# Patient Record
Sex: Female | Born: 1970 | Race: White | Hispanic: Yes | Marital: Single | Smoking: Never smoker
Health system: Southern US, Community
[De-identification: ages and names within clinical notes are randomized; demographics above are authoritative.]

---

## 2004-12-08 ENCOUNTER — Emergency Department (HOSPITAL_COMMUNITY): Admission: EM | Admit: 2004-12-08 | Discharge: 2004-12-08 | Payer: Self-pay | Admitting: Emergency Medicine

## 2004-12-11 ENCOUNTER — Inpatient Hospital Stay (HOSPITAL_COMMUNITY): Admission: AD | Admit: 2004-12-11 | Discharge: 2004-12-11 | Payer: Self-pay | Admitting: *Deleted

## 2004-12-11 ENCOUNTER — Encounter (INDEPENDENT_AMBULATORY_CARE_PROVIDER_SITE_OTHER): Payer: Self-pay | Admitting: Specialist

## 2004-12-11 ENCOUNTER — Emergency Department (HOSPITAL_COMMUNITY): Admission: EM | Admit: 2004-12-11 | Discharge: 2004-12-11 | Payer: Self-pay | Admitting: Emergency Medicine

## 2004-12-20 ENCOUNTER — Ambulatory Visit: Payer: Self-pay | Admitting: Family Medicine

## 2005-06-08 ENCOUNTER — Emergency Department (HOSPITAL_COMMUNITY): Admission: EM | Admit: 2005-06-08 | Discharge: 2005-06-08 | Payer: Self-pay | Admitting: Emergency Medicine

## 2006-12-07 ENCOUNTER — Inpatient Hospital Stay (HOSPITAL_COMMUNITY): Admission: AD | Admit: 2006-12-07 | Discharge: 2006-12-10 | Payer: Self-pay | Admitting: Obstetrics

## 2010-03-03 ENCOUNTER — Encounter: Payer: Self-pay | Admitting: *Deleted

## 2010-05-17 LAB — ABO/RH: RH Type: POSITIVE

## 2010-05-17 LAB — ANTIBODY SCREEN: Antibody Screen: NEGATIVE

## 2010-05-17 LAB — HEPATITIS B SURFACE ANTIGEN: Hepatitis B Surface Ag: NEGATIVE

## 2010-05-17 LAB — RUBELLA ANTIBODY, IGM: Rubella: UNDETERMINED

## 2010-05-17 LAB — RPR: RPR: NONREACTIVE

## 2010-06-26 NOTE — Op Note (Signed)
[name], Jacqueline Serrano      ACCOUNT NO.:  [id number]   MEDICAL RECORD NO.:  [id number]          PATIENT TYPE:  INP   LOCATION:  9135                          [facility]   PHYSICIAN:  Kathreen Cosier, M.D.DATE OF BIRTH:  1970/07/15   DATE OF PROCEDURE:  12/07/2006  DATE OF DISCHARGE:                               OPERATIVE REPORT   PREOPERATIVE DIAGNOSES:  1. Intrauterine pregnancy at term.  2. Previous cesarean section with macrosomia, for elective cesarean      section.  3. Ruptured membranes.   SURGEON:  Francoise Ceo, M.D.   ANESTHESIA:  Spinal.   PROCEDURE:  Patient placed on the operating table in the supine position  after spinal administered.  Abdomen prepped and draped.  Bladder emptied  into a Foley catheter.  A transverse suprapubic incision was made  through the old scar, carried down through rectus fascia.  Fascia  cleaned and incised the length the incision.  Rectal muscles retracted  laterally.  Peritoneum incised longitudinally.  A transverse incision  made on the visceral peritoneum above the bladder.  Bladder mobilized  inferiorly.  There was a window where the old scar adhesed.  Fluid was  clear.  Patient delivered from OP position of a female, Apgars 9 and 9,  weighing 9 pounds.  Team was in attendance.  The placenta was posterior  and removed spontaneously and the uterine cavity cleaned with dry laps.  Uterine incision closed in one layer with continuous suture of #1  chromic.  Hemostasis satisfactory.  Bladder flap retention with #0  chromic.  Uterus was contracted, tubes and ovaries normal.  Abdomen  closed in layers.  Peritoneum continuous suture of #0 chromic, fascia  continuous suture of #0 Dexon and the skin closed with a subcuticular  stitch of #4-0 Monocryl.  Blood loss 800 mL.           ______________________________  Kathreen Cosier, M.D.     BAM/MEDQ  D:  12/07/2006  T:  12/07/2006  Job:  [id number]

## 2010-06-29 NOTE — Group Therapy Note (Signed)
NAME:  Jacqueline Serrano, Jacqueline Serrano NO.:  [id number]   MEDICAL RECORD NO.:  [id number]          PATIENT TYPE:  WOC   LOCATION:  [facility]                   FACILITY:  [facility]   PHYSICIAN:  Tinnie Gens, MD        DATE OF BIRTH:  [birth year]   DATE OF SERVICE:                                    CLINIC NOTE   CHIEF COMPLAINT:  Follow up miscarriage.   HISTORY OF PRESENT ILLNESS:  The patient is a 40 year old gravida 2, para 1-  0-1-1 who is status post SAB on December 11, 2004 who had products of  conception sent to pathology which indeed showed products of conception.  The patient reports passing a bit more tissue on the next day following that  and then bleeding on and off for the last 10 days, but has stopped over the  last 3 days.  She is having no significant pain.  No fever.  No chills.  No  nausea.  No vomiting.   PAST MEDICAL HISTORY:  She has had several kidney infections.   PAST SURGICAL HISTORY:  C section.   MEDICATIONS:  None.   ALLERGIES:  None known.   OBSTETRICAL HISTORY:  G2, P1, one C section for what she says was a large  baby based on ultrasound in [place].   GYNECOLOGICAL HISTORY:  Menarche at age 65.  Cycles every month.  It lasts  for 4 days.  Medium flow.  No history of abnormal Pap.  Last Pap was 3  months ago at [facility].   FAMILY HISTORY:  Negative.   SOCIAL HISTORY:  No tobacco, alcohol or drug use.   REVIEW OF SYSTEMS:  A 14-point review of systems was reviewed.  Please see  GYN history on the chart.  The patient does report some headache around the  time of miscarriage.  These have resolved.  Some belly pain and bleeding  vaginally is described in the HPI, but otherwise is negative.   PHYSICAL EXAMINATION:  VITAL SIGNS:  Her vital signs are as noted on the  chart.  Her blood pressure is 115/76.  Weight is 126.6.  Temp 98.7.  Pulse  76.  GENERAL:  She is a well-developed, well-nourished, Hispanic female in no  acute distress.  ABDOMEN:   Soft, nontender, nondistended.  GU:  Normal external female genitalia.  The vagina is pink and rugated.  The  cervix is long and closed.  The uterus is small and anteverted, firm.  It is  between 4 and 6 weeks' size.   IMPRESSION:  Probable complete abortion.   PLAN:  Advised about miscarriage, causes of miscarriage, likelihood of  repeat very low and need to not get pregnant for the next 3 months because  of increased risk of congenital anomalies.  The patient understood all these  things.  It was explained at least twice about this probably being a  complete abortion given her lack of symptoms and normal exam.  She will  follow up as needed.           ______________________________  Tinnie Gens, MD     TP/MEDQ  D:  12/20/2004  T:  12/20/2004  Job:  [id number]

## 2010-06-29 NOTE — Discharge Summary (Signed)
Jacqueline Serrano, THEDFORD      ACCOUNT NO.:  [id number]   MEDICAL RECORD NO.:  [id number]          PATIENT TYPE:  INP   LOCATION:  9135                          [facility]   PHYSICIAN:  Kathreen Cosier, M.D.DATE OF BIRTH:  [birth year]   DATE OF ADMISSION:  12/07/2006  DATE OF DISCHARGE:  12/10/2006                               DISCHARGE SUMMARY   The patient is a 40 year old gravida 3, para 1-0-1-1, [facility] December 08, 2006.  She had an elective C-section with her first child in [place] as  she had a large baby, and she was now admitted with ruptured membranes  and ultrasound gave an estimated fetal weight of 9 pounds 10 ounces.  The patient elected to have a repeat C-section.  She underwent a repeat  low transverse cesarean section and had a female, Apgar 9 and 9, weighing  9 pounds, from the OP position.  The team was in attendance.  Placenta  was sent to labor and delivery.  On admission her hemoglobin was 10.8,  white count 6.2, platelets 222.  Postop hemoglobin 7.5, platelets 179.  RPR negative.  HIV negative.  The patient was asymptomatic and started  on ferrous sulfate 325 mg p.o. b.i.d.  The patient had an uneventful  postoperative course and was discharged on the third postoperative day  ambulatory on a regular diet, on Tylox one every 3-4 hours p.r.n. for  pain, ferrous sulfate 325 mg one b.i.d. for anemia.   DISCHARGE DIAGNOSIS:  Status post elective low transverse cesarean  section at term because of macrosomia.           ______________________________  Kathreen Cosier, M.D.     BAM/MEDQ  D:  12/31/2006  T:  12/31/2006  Job:  [id number]

## 2010-10-18 ENCOUNTER — Encounter (HOSPITAL_COMMUNITY): Payer: Self-pay

## 2010-10-18 ENCOUNTER — Ambulatory Visit (HOSPITAL_COMMUNITY)
Admission: RE | Admit: 2010-10-18 | Discharge: 2010-10-18 | Disposition: A | Discharge status: Home / Self Care | Payer: Self-pay | Source: Ambulatory Visit | Attending: Obstetrics | Admitting: Obstetrics

## 2010-10-18 ENCOUNTER — Ambulatory Visit (HOSPITAL_COMMUNITY): Payer: Self-pay

## 2010-10-18 DIAGNOSIS — O24410 Gestational diabetes mellitus in pregnancy, diet controlled: Secondary | ICD-10-CM

## 2010-10-18 NOTE — Progress Notes (Signed)
Jacqueline Serrano is a 40 y.o. female presenting for Continued assessment of glycemic responses to maintain FBS <95 mg/dLdaily; PPBG <120 mg/d/L daily.  Maternal Medical History:  Fetal activity: Perceived fetal activity is normal.    Prenatal Complications - Diabetes: gestational. Diabetes is managed by diet.     Three prior cesarean deliveries. Discussed risk of future pregnancies and consideration of a permanent method of family planning. No other medical problems noted.   No past medical history on file. No past surgical history on file. Family History: family history is not on file. Social History:  does not have a smoking history on file. She does not have any smokeless tobacco history on file. Her alcohol and drug histories not on file.  Review of Systems  Constitutional: Negative.   HENT: Negative.   Eyes: Negative.   Respiratory: Negative.   Cardiovascular: Negative.   Gastrointestinal: Negative.   Genitourinary: Negative.   Musculoskeletal: Negative.   Skin: Negative.   All other systems reviewed and are negative.   Maternal Exam:  Abdomen: not evaluated.  Introitus: not evaluated.     Physical Exam  Vitals reviewed. Constitutional: She appears well-developed and well-nourished.      Assessment/Plan: The need for home glucose monitoring regularly, (especially initially), and symptoms of potential hypoglycemia are discussed. To manage with diet/exercise to determine if  fasting glucose is <95 mg/dL, postprandial glucose is <120 mg/dL and then call with results. Patient ws counseled by the diabetic educator for teaching diet and meter use. Fetal ultrasound assessment for growth recommended in two weeks.  Fetal non-stress testing is not required for diet controlled GDM, but if insulin or oral hypoglycemics are initiated later, would then recommend NST's.  Face to face time of consultation was 25 minutes.  Thank you for the opportunity to work with  [place]  Marland KitchenBOTTI,JOE 10/18/2010, 8:15 AM

## 2010-10-18 NOTE — ED Notes (Signed)
  Patient was seen on 10/18/2010 for Gestational Diabetes self-management class at [facility]. The following learning objectives were met by the patient during this course: Client was seen 1:1 with the assistance of the Spanish [employer] from [place].  She questioned and related via interpreter that she understood the materials as presented.   States the definition of Gestational Diabetes  States why dietary management is important in controlling blood glucose  Describes the effects each nutrient has on blood glucose levels  Demonstrates ability to create a balanced meal plan  Demonstrates carbohydrate counting   States when to check blood glucose levels  Demonstrates proper blood glucose monitoring techniques  States the effect of stress and exercise on blood glucose levels  States the importance of limiting caffeine and abstaining from alcohol and smoking  Blood glucose monitor given: No glucose meter was provided.  I did cover the procedure and provided handout containing the steps for glucose testing.  Recommended she purchase a Reli-On glucose meter from [employer] along with the strips and lancets.  She was provided a number of sheets for recording her blood glucose levels.  Her husband speaks [place] and is aware that the [facility] will be calling him to report her blood glucose readings.  His phone is [phone].  Blood glucose reading: 3 hours following lunch was 144mg /dl  Patient instructed to monitor glucose levels:Fasting and 2 hours after the first bite of each meal. FBS: 60 - <90 2 hour: <120  *Patient received handouts:  Nutrition Diabetes and Pregnancy  Carbohydrate Counting List All handouts were in Spanish. Patient will be seen for follow-up as needed and the [facility] nurse will be calling for weekly blood glucose levels for the MD to follow.

## 2010-11-01 ENCOUNTER — Encounter (HOSPITAL_COMMUNITY): Payer: Self-pay

## 2010-11-01 ENCOUNTER — Ambulatory Visit (HOSPITAL_COMMUNITY)
Admission: RE | Admit: 2010-11-01 | Discharge: 2010-11-01 | Disposition: A | Discharge status: Home / Self Care | Payer: Self-pay | Source: Ambulatory Visit | Attending: Obstetrics | Admitting: Obstetrics

## 2010-11-01 VITALS — BP 108/56 | HR 81 | Wt 149.0 lb

## 2010-11-01 DIAGNOSIS — O09529 Supervision of elderly multigravida, unspecified trimester: Secondary | ICD-10-CM | POA: Insufficient documentation

## 2010-11-01 DIAGNOSIS — O34219 Maternal care for unspecified type scar from previous cesarean delivery: Secondary | ICD-10-CM | POA: Insufficient documentation

## 2010-11-01 DIAGNOSIS — O99810 Abnormal glucose complicating pregnancy: Secondary | ICD-10-CM | POA: Insufficient documentation

## 2010-11-01 DIAGNOSIS — O24410 Gestational diabetes mellitus in pregnancy, diet controlled: Secondary | ICD-10-CM

## 2010-11-11 ENCOUNTER — Inpatient Hospital Stay (HOSPITAL_COMMUNITY): Admission: AD | Admit: 2010-11-11 | Payer: Self-pay | Source: Ambulatory Visit | Admitting: Obstetrics

## 2010-11-21 LAB — CBC
HCT: 21.3 — ABNORMAL LOW
HCT: 31.2 — ABNORMAL LOW
Hemoglobin: 10.8 — ABNORMAL LOW
Hemoglobin: 7.5 — CL
MCHC: 34.5
MCHC: 35.1
MCV: 88.4
MCV: 88.7
Platelets: 179
Platelets: 222
RBC: 2.4 — ABNORMAL LOW
RBC: 3.54 — ABNORMAL LOW
RDW: 14.2 — ABNORMAL HIGH
RDW: 14.6 — ABNORMAL HIGH
WBC: 6.2
WBC: 7.1

## 2010-11-21 LAB — RPR: RPR Ser Ql: NONREACTIVE

## 2010-11-29 ENCOUNTER — Ambulatory Visit (HOSPITAL_COMMUNITY)
Admission: RE | Admit: 2010-11-29 | Discharge: 2010-11-29 | Disposition: A | Discharge status: Home / Self Care | Payer: Self-pay | Source: Ambulatory Visit | Attending: Obstetrics | Admitting: Obstetrics

## 2010-11-29 DIAGNOSIS — O24410 Gestational diabetes mellitus in pregnancy, diet controlled: Secondary | ICD-10-CM

## 2010-11-29 DIAGNOSIS — O34219 Maternal care for unspecified type scar from previous cesarean delivery: Secondary | ICD-10-CM | POA: Insufficient documentation

## 2010-11-29 DIAGNOSIS — O09529 Supervision of elderly multigravida, unspecified trimester: Secondary | ICD-10-CM | POA: Insufficient documentation

## 2010-11-29 DIAGNOSIS — O99810 Abnormal glucose complicating pregnancy: Secondary | ICD-10-CM | POA: Insufficient documentation

## 2010-12-17 NOTE — Patient Instructions (Addendum)
   Your procedure is scheduled on:  Tuesday, Nov. 13  Enter through the [employer] of [facility] at:  6:00am Pick up the phone at the desk and dial [phone] and inform us of your arrival.  Please call this number if you have any problems the morning of surgery: [phone]  Remember: Do not eat food after midnight: Monday Do not drink clear liquids after: Monday Take these medicines the morning of surgery with a SIP OF WATER:none  Do not wear jewelry, make-up, or FINGER nail polish Do not wear lotions, powders, or perfumes.  You may not  wear deodorant. Do not shave 48 hours prior to surgery. Do not bring valuables to the hospital.  Leave suitcase in the car. After Surgery it may be brought to your room. For patients being admitted to the hospital, checkout time is 11:00am the day of discharge.    Remember to use your hibiclens as instructed.Please shower with 1/2 bottle the evening before your surgery and the other 1/2 bottle the morning of surgery.

## 2010-12-18 MED ORDER — OXYTOCIN 10 UNIT/ML IJ SOLN
INTRAMUSCULAR | Status: AC
  Filled 2010-12-18: qty 4

## 2010-12-18 MED ORDER — ONDANSETRON HCL 4 MG/2ML IJ SOLN
INTRAMUSCULAR | Status: AC
  Filled 2010-12-18: qty 2

## 2010-12-18 MED ORDER — MORPHINE SULFATE 0.5 MG/ML IJ SOLN
INTRAMUSCULAR | Status: AC
  Filled 2010-12-18: qty 10

## 2010-12-18 MED ORDER — SODIUM BICARBONATE 8.4 % IV SOLN
INTRAVENOUS | Status: AC
  Filled 2010-12-18: qty 50

## 2010-12-18 MED ORDER — LIDOCAINE-EPINEPHRINE (PF) 2 %-1:200000 IJ SOLN
INTRAMUSCULAR | Status: AC
  Filled 2010-12-18: qty 20

## 2010-12-19 ENCOUNTER — Other Ambulatory Visit: Payer: Self-pay | Admitting: Obstetrics

## 2010-12-20 ENCOUNTER — Encounter (HOSPITAL_COMMUNITY)
Admission: RE | Admit: 2010-12-20 | Discharge: 2010-12-20 | Disposition: A | Discharge status: Home / Self Care | Payer: Medicaid Other | Source: Ambulatory Visit | Attending: Obstetrics | Admitting: Obstetrics

## 2010-12-20 ENCOUNTER — Encounter (HOSPITAL_COMMUNITY): Payer: Self-pay

## 2010-12-20 LAB — CBC
HCT: 35 % — ABNORMAL LOW (ref 36.0–46.0)
MCH: 29.5 pg (ref 26.0–34.0)
MCHC: 32.6 g/dL (ref 30.0–36.0)
Platelets: 196 10*3/uL (ref 150–400)
RBC: 3.87 MIL/uL (ref 3.87–5.11)
RDW: 16.3 % — ABNORMAL HIGH (ref 11.5–15.5)
WBC: 5.8 10*3/uL (ref 4.0–10.5)

## 2010-12-20 LAB — BASIC METABOLIC PANEL
BUN: 10 mg/dL (ref 6–23)
CO2: 24 mEq/L (ref 19–32)
Calcium: 9.1 mg/dL (ref 8.4–10.5)
Creatinine, Ser: 0.54 mg/dL (ref 0.50–1.10)
GFR calc Af Amer: >90 mL/min (ref >90)
GFR calc non Af Amer: >90 mL/min (ref >90)
Glucose, Bld: 95 mg/dL (ref 70–99)
Potassium: 3.7 mEq/L (ref 3.5–5.1)
Sodium: 134 mEq/L — ABNORMAL LOW (ref 135–145)

## 2010-12-20 LAB — SURGICAL PCR SCREEN: MRSA, PCR: NEGATIVE

## 2010-12-20 NOTE — Pre-Procedure Instructions (Addendum)
PAT visit assisted per Spanish Interpreter-Eda Royal Pt instructions reviewed with husband-speaks [place]

## 2010-12-24 ENCOUNTER — Other Ambulatory Visit: Payer: Self-pay | Admitting: Obstetrics

## 2010-12-24 NOTE — H&P (Signed)
NAMEROISE, EMERT NO.:  [id number]  MEDICAL RECORD NO.:  [id number]  LOCATION:  [facility]                           [facility]  PHYSICIAN:  Kathreen Cosier, M.D.DATE OF BIRTH:  May 04, 1970  DATE OF ADMISSION:  12/20/2010 DATE OF DISCHARGE:  12/20/2010                             HISTORY & PHYSICAL   The patient is a 40 year old, gravida 4, para 2-0-1-2, whose due date is December 31, 2010.  She has had 2 previous C-sections and is now at 39 weeks for repeat C-section.  PAST MEDICAL HISTORY:  She is a gestational diabetic whose sugars are normal and controlled by diet.  PAST SURGICAL HISTORY:  She has had 2 C-sections.  SOCIAL HISTORY:  Negative.  SYSTEM REVIEW:  She has a history of depression and was on Zoloft 50 mg daily.  PHYSICAL EXAMINATION:  GENERAL:  A well-developed female in no distress. HEENT:  Negative. LUNGS:  Clear to P and A. HEART:  Regular rhythm.  No murmurs or gallops. BREASTS:  No masses. ABDOMEN:  Term pelvic.  Cervix closed. EXTREMITIES:  Negative.          ______________________________ Kathreen Cosier, M.D.     BAM/MEDQ  D:  12/24/2010  T:  12/24/2010  Job:  [id number]

## 2010-12-25 ENCOUNTER — Encounter (HOSPITAL_COMMUNITY): Payer: Self-pay | Admitting: Anesthesiology

## 2010-12-25 ENCOUNTER — Inpatient Hospital Stay (HOSPITAL_COMMUNITY)
Admission: RE | Admit: 2010-12-25 | Discharge: 2010-12-28 | DRG: 766 | Disposition: A | Discharge status: Home / Self Care | Payer: Medicaid Other | Source: Ambulatory Visit | Attending: Obstetrics | Admitting: Obstetrics

## 2010-12-25 ENCOUNTER — Encounter (HOSPITAL_COMMUNITY): Payer: Self-pay | Admitting: *Deleted

## 2010-12-25 ENCOUNTER — Inpatient Hospital Stay (HOSPITAL_COMMUNITY): Payer: Medicaid Other | Admitting: Anesthesiology

## 2010-12-25 ENCOUNTER — Encounter (HOSPITAL_COMMUNITY)
Admission: RE | Disposition: A | Discharge status: Home / Self Care | Payer: Self-pay | Source: Ambulatory Visit | Attending: Obstetrics

## 2010-12-25 DIAGNOSIS — Z01812 Encounter for preprocedural laboratory examination: Secondary | ICD-10-CM

## 2010-12-25 DIAGNOSIS — O09529 Supervision of elderly multigravida, unspecified trimester: Secondary | ICD-10-CM | POA: Diagnosis present

## 2010-12-25 DIAGNOSIS — O34219 Maternal care for unspecified type scar from previous cesarean delivery: Principal | ICD-10-CM | POA: Diagnosis present

## 2010-12-25 DIAGNOSIS — Z01818 Encounter for other preprocedural examination: Secondary | ICD-10-CM

## 2010-12-25 DIAGNOSIS — O9903 Anemia complicating the puerperium: Secondary | ICD-10-CM | POA: Clinically undetermined

## 2010-12-25 DIAGNOSIS — D649 Anemia, unspecified: Secondary | ICD-10-CM | POA: Diagnosis not present

## 2010-12-25 LAB — ABO/RH: ABO/RH(D): O POS

## 2010-12-25 SURGERY — Surgical Case
Anesthesia: Spinal | Laterality: Bilateral

## 2010-12-25 MED ORDER — MEPERIDINE HCL 25 MG/ML IJ SOLN: 6.2500 mg | INTRAMUSCULAR | Status: DC | PRN

## 2010-12-25 MED ORDER — PHENYLEPHRINE HCL 10 MG/ML IJ SOLN
INTRAMUSCULAR | Status: DC | PRN
  Administered 2010-12-25 (×2): 40 ug via INTRAVENOUS

## 2010-12-25 MED ORDER — IBUPROFEN 600 MG PO TABS
600.0000 mg | ORAL_TABLET | Freq: Four times a day (QID) | ORAL | Status: DC | PRN
  Filled 2010-12-25 (×12): qty 1

## 2010-12-25 MED ORDER — LACTATED RINGERS IV SOLN
INTRAVENOUS | Status: DC
  Administered 2010-12-25: 07:00:00 via INTRAVENOUS
  Administered 2010-12-25: 125 mL/h via INTRAVENOUS
  Administered 2010-12-25 (×2): via INTRAVENOUS

## 2010-12-25 MED ORDER — HYDROMORPHONE HCL PF 1 MG/ML IJ SOLN: 0.2500 mg | INTRAMUSCULAR | Status: DC | PRN

## 2010-12-25 MED ORDER — NALOXONE HCL 0.4 MG/ML IJ SOLN: 0.4000 mg | INTRAMUSCULAR | Status: DC | PRN

## 2010-12-25 MED ORDER — SCOPOLAMINE 1 MG/3DAYS TD PT72
1.0000 | MEDICATED_PATCH | Freq: Once | TRANSDERMAL | Status: DC
  Administered 2010-12-25: 1.5 mg via TRANSDERMAL

## 2010-12-25 MED ORDER — KETOROLAC TROMETHAMINE 30 MG/ML IJ SOLN: 30.0000 mg | Freq: Four times a day (QID) | INTRAMUSCULAR | Status: AC | PRN

## 2010-12-25 MED ORDER — ONDANSETRON HCL 4 MG/2ML IJ SOLN
INTRAMUSCULAR | Status: DC | PRN
  Administered 2010-12-25: 4 mg via INTRAVENOUS

## 2010-12-25 MED ORDER — LANOLIN HYDROUS EX OINT: 1.0000 "application " | TOPICAL_OINTMENT | CUTANEOUS | Status: DC | PRN

## 2010-12-25 MED ORDER — NALBUPHINE HCL 10 MG/ML IJ SOLN
5.0000 mg | INTRAMUSCULAR | Status: DC | PRN
  Filled 2010-12-25: qty 1

## 2010-12-25 MED ORDER — ONDANSETRON HCL 4 MG/2ML IJ SOLN
INTRAMUSCULAR | Status: AC
  Filled 2010-12-25: qty 2

## 2010-12-25 MED ORDER — DIPHENHYDRAMINE HCL 25 MG PO CAPS: 25.0000 mg | ORAL_CAPSULE | ORAL | Status: DC | PRN

## 2010-12-25 MED ORDER — CEFAZOLIN SODIUM 1-5 GM-% IV SOLN
INTRAVENOUS | Status: AC
  Filled 2010-12-25: qty 50

## 2010-12-25 MED ORDER — ONDANSETRON HCL 4 MG PO TABS: 4.0000 mg | ORAL_TABLET | ORAL | Status: DC | PRN

## 2010-12-25 MED ORDER — PRENATAL PLUS 27-1 MG PO TABS
1.0000 | ORAL_TABLET | Freq: Every day | ORAL | Status: DC
  Administered 2010-12-26 – 2010-12-28 (×3): 1 via ORAL
  Filled 2010-12-25 (×3): qty 1

## 2010-12-25 MED ORDER — DIPHENHYDRAMINE HCL 50 MG/ML IJ SOLN: 25.0000 mg | INTRAMUSCULAR | Status: DC | PRN

## 2010-12-25 MED ORDER — OXYTOCIN 10 UNIT/ML IJ SOLN
INTRAMUSCULAR | Status: AC
  Filled 2010-12-25: qty 4

## 2010-12-25 MED ORDER — SCOPOLAMINE 1 MG/3DAYS TD PT72
MEDICATED_PATCH | TRANSDERMAL | Status: AC
  Administered 2010-12-25: 1.5 mg via TRANSDERMAL
  Filled 2010-12-25: qty 1

## 2010-12-25 MED ORDER — SIMETHICONE 80 MG PO CHEW
80.0000 mg | CHEWABLE_TABLET | Freq: Three times a day (TID) | ORAL | Status: DC
  Administered 2010-12-25 – 2010-12-28 (×11): 80 mg via ORAL

## 2010-12-25 MED ORDER — IBUPROFEN 600 MG PO TABS
600.0000 mg | ORAL_TABLET | Freq: Four times a day (QID) | ORAL | Status: DC
  Administered 2010-12-25 – 2010-12-28 (×12): 600 mg via ORAL

## 2010-12-25 MED ORDER — EPHEDRINE SULFATE 50 MG/ML IJ SOLN
INTRAMUSCULAR | Status: DC | PRN
  Administered 2010-12-25 (×5): 5 mg via INTRAVENOUS

## 2010-12-25 MED ORDER — SODIUM CHLORIDE 0.9 % IV SOLN
1.0000 ug/kg/h | INTRAVENOUS | Status: DC | PRN
  Filled 2010-12-25: qty 2.5

## 2010-12-25 MED ORDER — WITCH HAZEL-GLYCERIN EX PADS: 1.0000 "application " | MEDICATED_PAD | CUTANEOUS | Status: DC | PRN

## 2010-12-25 MED ORDER — MENTHOL 3 MG MT LOZG: 1.0000 | LOZENGE | OROMUCOSAL | Status: DC | PRN

## 2010-12-25 MED ORDER — SODIUM CHLORIDE 0.9 % IJ SOLN: 3.0000 mL | INTRAMUSCULAR | Status: DC | PRN

## 2010-12-25 MED ORDER — ZOLPIDEM TARTRATE 5 MG PO TABS: 5.0000 mg | ORAL_TABLET | Freq: Every evening | ORAL | Status: DC | PRN

## 2010-12-25 MED ORDER — OXYCODONE-ACETAMINOPHEN 5-325 MG PO TABS
1.0000 | ORAL_TABLET | ORAL | Status: DC | PRN
  Administered 2010-12-26 – 2010-12-27 (×3): 1 via ORAL
  Filled 2010-12-25 (×3): qty 1

## 2010-12-25 MED ORDER — SENNOSIDES-DOCUSATE SODIUM 8.6-50 MG PO TABS
2.0000 | ORAL_TABLET | Freq: Every day | ORAL | Status: DC
  Administered 2010-12-25 – 2010-12-27 (×3): 2 via ORAL

## 2010-12-25 MED ORDER — EPHEDRINE 5 MG/ML INJ
INTRAVENOUS | Status: AC
  Filled 2010-12-25: qty 10

## 2010-12-25 MED ORDER — OXYTOCIN 20 UNITS IN LACTATED RINGERS INFUSION - SIMPLE
INTRAVENOUS | Status: DC | PRN
  Administered 2010-12-25: 20 [IU] via INTRAVENOUS

## 2010-12-25 MED ORDER — PHENYLEPHRINE 40 MCG/ML (10ML) SYRINGE FOR IV PUSH (FOR BLOOD PRESSURE SUPPORT)
PREFILLED_SYRINGE | INTRAVENOUS | Status: AC
  Filled 2010-12-25: qty 5

## 2010-12-25 MED ORDER — CEFAZOLIN SODIUM 1-5 GM-% IV SOLN
1.0000 g | Freq: Once | INTRAVENOUS | Status: AC
  Administered 2010-12-25: 1 g via INTRAVENOUS

## 2010-12-25 MED ORDER — BUPIVACAINE IN DEXTROSE 0.75-8.25 % IT SOLN
INTRATHECAL | Status: DC | PRN
  Administered 2010-12-25: 11.75 mg via INTRATHECAL

## 2010-12-25 MED ORDER — KETOROLAC TROMETHAMINE 60 MG/2ML IM SOLN
INTRAMUSCULAR | Status: AC
  Administered 2010-12-25: 60 mg via INTRAMUSCULAR
  Filled 2010-12-25: qty 2

## 2010-12-25 MED ORDER — KETOROLAC TROMETHAMINE 30 MG/ML IJ SOLN: 15.0000 mg | Freq: Once | INTRAMUSCULAR | Status: DC | PRN

## 2010-12-25 MED ORDER — FENTANYL CITRATE 0.05 MG/ML IJ SOLN
INTRAMUSCULAR | Status: AC
  Filled 2010-12-25: qty 2

## 2010-12-25 MED ORDER — ONDANSETRON HCL 4 MG/2ML IJ SOLN: 4.0000 mg | INTRAMUSCULAR | Status: DC | PRN

## 2010-12-25 MED ORDER — FENTANYL CITRATE 0.05 MG/ML IJ SOLN
INTRAMUSCULAR | Status: DC | PRN
  Administered 2010-12-25: 15 ug via INTRATHECAL

## 2010-12-25 MED ORDER — MORPHINE SULFATE (PF) 0.5 MG/ML IJ SOLN
INTRAMUSCULAR | Status: DC | PRN
  Administered 2010-12-25: .1 mg via INTRATHECAL

## 2010-12-25 MED ORDER — DIPHENHYDRAMINE HCL 50 MG/ML IJ SOLN: 12.5000 mg | INTRAMUSCULAR | Status: DC | PRN

## 2010-12-25 MED ORDER — MORPHINE SULFATE 0.5 MG/ML IJ SOLN
INTRAMUSCULAR | Status: AC
  Filled 2010-12-25: qty 10

## 2010-12-25 MED ORDER — LACTATED RINGERS IV SOLN: INTRAVENOUS | Status: DC

## 2010-12-25 MED ORDER — DIPHENHYDRAMINE HCL 25 MG PO CAPS: 25.0000 mg | ORAL_CAPSULE | Freq: Four times a day (QID) | ORAL | Status: DC | PRN

## 2010-12-25 MED ORDER — PROMETHAZINE HCL 25 MG/ML IJ SOLN: 6.2500 mg | INTRAMUSCULAR | Status: DC | PRN

## 2010-12-25 MED ORDER — SIMETHICONE 80 MG PO CHEW
80.0000 mg | CHEWABLE_TABLET | ORAL | Status: DC | PRN
  Administered 2010-12-25: 80 mg via ORAL

## 2010-12-25 MED ORDER — DIBUCAINE 1 % RE OINT: 1.0000 "application " | TOPICAL_OINTMENT | RECTAL | Status: DC | PRN

## 2010-12-25 MED ORDER — TETANUS-DIPHTH-ACELL PERTUSSIS 5-2.5-18.5 LF-MCG/0.5 IM SUSP
0.5000 mL | Freq: Once | INTRAMUSCULAR | Status: AC
  Administered 2010-12-26: 0.5 mL via INTRAMUSCULAR

## 2010-12-25 MED ORDER — ONDANSETRON HCL 4 MG/2ML IJ SOLN: 4.0000 mg | Freq: Three times a day (TID) | INTRAMUSCULAR | Status: DC | PRN

## 2010-12-25 MED ORDER — OXYTOCIN 20 UNITS IN LACTATED RINGERS INFUSION - SIMPLE: 125.0000 mL/h | INTRAVENOUS | Status: AC

## 2010-12-25 MED ORDER — KETOROLAC TROMETHAMINE 60 MG/2ML IM SOLN
60.0000 mg | Freq: Once | INTRAMUSCULAR | Status: AC | PRN
  Administered 2010-12-25: 60 mg via INTRAMUSCULAR
  Filled 2010-12-25: qty 2

## 2010-12-25 SURGICAL SUPPLY — 31 items
CHLORAPREP W/TINT 26ML (MISCELLANEOUS) ×2 IMPLANT
CLOTH BEACON ORANGE TIMEOUT ST (SAFETY) ×2 IMPLANT
CONTAINER PREFILL 10% NBF 15ML (MISCELLANEOUS) ×4 IMPLANT
DERMABOND ADVANCED (GAUZE/BANDAGES/DRESSINGS) ×1
DERMABOND ADVANCED .7 DNX12 (GAUZE/BANDAGES/DRESSINGS) ×1 IMPLANT
DRESSING TELFA 8X3 (GAUZE/BANDAGES/DRESSINGS) IMPLANT
ELECT REM PT RETURN 9FT ADLT (ELECTROSURGICAL) ×2
ELECTRODE REM PT RTRN 9FT ADLT (ELECTROSURGICAL) ×1 IMPLANT
EXTRACTOR VACUUM M CUP 4 TUBE (SUCTIONS) IMPLANT
GAUZE SPONGE 4X4 12PLY STRL LF (GAUZE/BANDAGES/DRESSINGS) ×4 IMPLANT
GLOVE BIO SURGEON STRL SZ8.5 (GLOVE) ×4 IMPLANT
GOWN PREVENTION PLUS LG XLONG (DISPOSABLE) ×4 IMPLANT
GOWN PREVENTION PLUS XXLARGE (GOWN DISPOSABLE) ×2 IMPLANT
KIT ABG SYR 3ML LUER SLIP (SYRINGE) IMPLANT
NEEDLE HYPO 25X5/8 SAFETYGLIDE (NEEDLE) ×2 IMPLANT
NS IRRIG 1000ML POUR BTL (IV SOLUTION) ×2 IMPLANT
PACK C SECTION WH (CUSTOM PROCEDURE TRAY) ×2 IMPLANT
PAD ABD 7.5X8 STRL (GAUZE/BANDAGES/DRESSINGS) IMPLANT
SLEEVE SCD COMPRESS KNEE MED (MISCELLANEOUS) IMPLANT
SUT CHROMIC 0 CT 802H (SUTURE) ×2 IMPLANT
SUT CHROMIC 1 CTX 36 (SUTURE) ×6 IMPLANT
SUT CHROMIC 2 0 SH (SUTURE) ×2 IMPLANT
SUT GUT PLAIN 0 CT-3 TAN 27 (SUTURE) ×2 IMPLANT
SUT MON AB 4-0 PS1 27 (SUTURE) ×2 IMPLANT
SUT VIC AB 0 CT1 18XCR BRD8 (SUTURE) IMPLANT
SUT VIC AB 0 CT1 8-18 (SUTURE)
SUT VIC AB 0 CTX 36 (SUTURE) ×2
SUT VIC AB 0 CTX36XBRD ANBCTRL (SUTURE) ×2 IMPLANT
TOWEL OR 17X24 6PK STRL BLUE (TOWEL DISPOSABLE) ×4 IMPLANT
TRAY FOLEY CATH 14FR (SET/KITS/TRAYS/PACK) ×2 IMPLANT
WATER STERILE IRR 1000ML POUR (IV SOLUTION) ×2 IMPLANT
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
## 2010-12-25 NOTE — Progress Notes (Signed)
[facility] Department of Clinical Social Work Documentation of Interpretation   I assisted ___Donna RN________________ with interpretation of __teaching____________________ for this patient. 

## 2010-12-25 NOTE — Anesthesia Postprocedure Evaluation (Signed)
Anesthesia Post Note  Patient: Field seismologist  Procedure(s) Performed:  CESAREAN SECTION WITH BILATERAL TUBAL LIGATION  Anesthesia type: Spinal  Patient location: PACU  Post pain: Pain level controlled  Post assessment: Post-op Vital signs reviewed  Post vital signs: Reviewed  Level of consciousness: awake  Complications: No apparent anesthesia complications

## 2010-12-25 NOTE — Op Note (Signed)
Preop diagnosis previous cesarean section at term Postop diagnosis the same Surgeon Dr. Gaynell Face Anesthesia spinal Procedure patient placed in the operative in the supine position after the spinal administered abdomen prepped and draped data entered with a Foley catheter a transverse incision made through the old scar carried him to the rectus fascia fascia cleaned and incised the length of the incision recti muscles retracted laterally peritoneum opened longitudinally transverse incision made in the visceroperitoneum above the bladder bladder mobilized inferiorly transverse low uterine incision made the fluid was clear patient delivered from the LOA position of a female Apgar 99 weighing 7 lbs. 8 oz. the placenta was anterior removed manually and sent to labor and delivery uterine cavity shows a clean with dry laps the uterine incision closed in one layer with continuous within normal on chromic hemostasis satisfactory bladder flap reattached to a chromic uterus well contracted tubes and ovaries normal abdomen chosen as peritoneum continuous with 2-0 chromic fascia continuous with of 0 Dexon and the skin shows a subcuticular stitch of 4-0 Monocryl blood loss him 150 cc patient tolerated the procedure well taken to recovery room in good condition thank and

## 2010-12-25 NOTE — Anesthesia Preprocedure Evaluation (Signed)
Anesthesia Evaluation  Patient identified by MRN, date of birth, ID band Patient awake    Reviewed: Allergy & Precautions, H&P , NPO status , Patient's Chart, lab work & pertinent test results  Airway Mallampati: II TM Distance: >3 FB Neck ROM: full    Dental No notable dental hx.    Pulmonary neg pulmonary ROS,    Pulmonary exam normal       Cardiovascular neg cardio ROS     Neuro/Psych Negative Neurological ROS  Negative Psych ROS   GI/Hepatic negative GI ROS, Neg liver ROS,   Endo/Other    Renal/GU negative Renal ROS  Genitourinary negative   Musculoskeletal negative musculoskeletal ROS (+)   Abdominal Normal abdominal exam  (+)   Peds negative pediatric ROS (+)  Hematology negative hematology ROS (+)   Anesthesia Other Findings   Reproductive/Obstetrics (+) Pregnancy                           Anesthesia Physical Anesthesia Plan  ASA: II  Anesthesia Plan: Spinal   Post-op Pain Management:    Induction:   Airway Management Planned:   Additional Equipment:   Intra-op Plan:   Post-operative Plan:   Informed Consent: I have reviewed the patients History and Physical, chart, labs and discussed the procedure including the risks, benefits and alternatives for the proposed anesthesia with the patient or authorized representative who has indicated his/her understanding and acceptance.     Plan Discussed with:   Anesthesia Plan Comments:         Anesthesia Quick Evaluation

## 2010-12-25 NOTE — Consult Note (Signed)
Called to attend a repeat C/S at term gestation with no report of risk factors. At delivery infant in vertex with spontaneous cries and active tone. Nuchal cord x 1 loose.  Given tactile stimulation and bulb suction. Shown to parents then carried to Transitional Nursery by father.   Care to assigned pediatrician. Has not voided.   Dagoberto Ligas MD [facility]

## 2010-12-25 NOTE — Transfer of Care (Signed)
Immediate Anesthesia Transfer of Care Note  Patient: Jacqueline Serrano  Procedure(s) Performed:  CESAREAN SECTION WITH BILATERAL TUBAL LIGATION  Patient Location: PACU  Anesthesia Type: Spinal  Level of Consciousness: awake, alert  and oriented  Airway & Oxygen Therapy: Patient Spontanous Breathing  Post-op Assessment: Report given to PACU RN and Post -op Vital signs reviewed and stable  Post vital signs: Reviewed and stable  Complications: No apparent anesthesia complications

## 2010-12-25 NOTE — Progress Notes (Signed)
UR chart review completed.  

## 2010-12-25 NOTE — Anesthesia Postprocedure Evaluation (Shared)
  Anesthesia Post-op Note  Patient: Jacqueline Serrano  Procedure(s) Performed:  CESAREAN SECTION WITH BILATERAL TUBAL LIGATION  Patient Location: PACU and Women's Unit  Anesthesia Type: Spinal  Level of Consciousness: awake, alert  and oriented  Airway and Oxygen Therapy: Patient Spontanous Breathing  Post-op Pain: mild  Post-op Assessment: Post-op Vital signs reviewed and Patient's Cardiovascular Status Stable  Post-op Vital Signs: Reviewed and stable  Complications: No apparent anesthesia complications

## 2010-12-25 NOTE — Progress Notes (Signed)
[facility] Department of Clinical Social Work Documentation of Interpretation   I assisted Dr Marshall___________________ with interpretation of __C- Section____________________ for this patient.

## 2010-12-25 NOTE — Addendum Note (Signed)
Addendum  created 12/25/10 1511 by Pat Patrick   Modules edited:Charges VN, Notes Section

## 2010-12-25 NOTE — H&P (Signed)
Since her history was dictated yesterday there has been no change in the history and on examination today her physical exam remains unchanged

## 2010-12-25 NOTE — Anesthesia Procedure Notes (Signed)
Spinal  Patient location during procedure: OR Start time: 12/25/2010 7:21 AM Staffing Performed by: anesthesiologist  Preanesthetic Checklist Completed: patient identified, site marked, surgical consent, pre-op evaluation, timeout performed, IV checked, risks and benefits discussed and monitors and equipment checked Spinal Block Patient position: sitting Prep: site prepped and draped and DuraPrep Patient monitoring: heart rate, cardiac monitor, continuous pulse ox and blood pressure Approach: midline Location: L3-4 Injection technique: single-shot Needle Needle type: Sprotte  Needle gauge: 24 G Needle length: 9 cm Assessment Sensory level: T4 Additional Notes Clear free flow CSF on first attempt.

## 2010-12-26 LAB — CBC
Hemoglobin: 8.5 g/dL — ABNORMAL LOW (ref 12.0–15.0)
RBC: 2.85 MIL/uL — ABNORMAL LOW (ref 3.87–5.11)

## 2010-12-27 DIAGNOSIS — O9903 Anemia complicating the puerperium: Secondary | ICD-10-CM | POA: Clinically undetermined

## 2010-12-27 NOTE — Progress Notes (Signed)
  Subjective: POD# 2 s/p Cesarean Delivery.  Indications: elective repeat  RH status/Rubella reviewed. Feeding: breast Patient reports tolerating PO.  Denies HA/SOB/C/P/N/V/dizziness.  Reports flatus or BM. Breast symptoms: no.  She reports vaginal bleeding as normal, without clots.  She is ambulating, urinating without difficulty.     Objective: Vital signs in last 24 hours: BP 110/68  Pulse 67  Temp(Src) 98.7 F (37.1 C) (Oral)  Resp 18  Wt 69.4 kg (153 lb)  SpO2 96%  Breastfeeding? Unknown       Physical Exam:  General: alert CV: Regular rate and rhythm Resp: clear Abdomen: soft, nontender, normal bowel sounds Lochia: minimal Uterine Fundus: firm, below umbilicus, nontender Incision: clean, dry and intact Ext: extremities normal, atraumatic, no cyanosis or edema    Basename 12/26/10 0555  HGB 8.5*  HCT 25.6*      Assessment/Plan: 40 y.o.  status post Cesarean section. POD# 2.   Doing well, stable. Anemia               Ambulate IS Routine post-op care  JACKSON-MOORE,Lam Bjorklund A 12/27/2010, 11:25 AM

## 2010-12-28 MED ORDER — IBUPROFEN 600 MG PO TABS: 600.0000 mg | ORAL_TABLET | Freq: Four times a day (QID) | ORAL | Status: AC | PRN

## 2010-12-28 MED ORDER — PRENATAL PLUS 27-1 MG PO TABS: 1.0000 | ORAL_TABLET | Freq: Every day | ORAL | Status: DC

## 2010-12-28 MED ORDER — MEASLES, MUMPS & RUBELLA VAC ~~LOC~~ INJ
0.5000 mL | INJECTION | Freq: Once | SUBCUTANEOUS | Status: AC
  Administered 2010-12-28: 0.5 mL via SUBCUTANEOUS
  Filled 2010-12-28: qty 0.5

## 2010-12-28 NOTE — Progress Notes (Signed)
Subjective: Postpartum Day 4: Cesarean Delivery Patient reports tolerating PO, + flatus and no problems voiding.  Desires DC home today, breast and bottle feeding.  Objective: Vital signs in last 24 hours: Temp:  [98.2 F (36.8 C)-98.4 F (36.9 C)] 98.2 F (36.8 C) (11/16 0530) Pulse Rate:  [66-80] 66  (11/16 0530) Resp:  [18] 18  (11/16 0530) BP: (100-105)/(60-68) 105/68 mmHg (11/16 0530)  Physical Exam:  General: alert, cooperative, appears stated age and no distress Lochia: appropriate Uterine Fundus: firm Incision: healing well, no significant drainage, no dehiscence, no significant erythema DVT Evaluation: No evidence of DVT seen on physical exam. Negative Homan's sign. No cords or calf tenderness. No significant calf/ankle edema.   Basename 12/26/10 0555  HGB 8.5*  HCT 25.6*    Assessment/Plan: Status post Cesarean section. Doing well postoperatively.  Discharge home with standard precautions and return to clinic in 4-6 weeks.  Anice Paganini CNM 12/28/2010, 9:08 AM

## 2010-12-28 NOTE — Discharge Summary (Signed)
Obstetric Discharge Summary Reason for Admission: cesarean section Prenatal Procedures: none Intrapartum Procedures: cesarean: low cervical, transverse Postpartum Procedures: Rubella Ig Complications-Operative and Postpartum: none Hemoglobin  Date Value Range Status  12/26/2010 8.5* 12.0-15.0 (g/dL) Final     HCT  Date Value Range Status  12/26/2010 25.6* 36.0-46.0 (%) Final    Discharge Diagnoses: Term Pregnancy-delivered  Discharge Information: Date: 12/28/2010 Activity: pelvic rest Diet: routine Medications: PNV and Ibuprofen Condition: stable Instructions: see written Discharge instructions Discharge to: home Follow-up Information    Follow up with MARSHALL,BERNARD A, MD. Call in 6 weeks. (Call for earlier appt if needed)    Contact information:   [address] [place] [address] [phone]          Newborn Data: Live born female  Birth Weight: 7 lb 8.6 oz (3420 g) APGAR: 9, 9  Home with mother.  Anice Paganini CNM 12/28/2010, 9:13 AM

## 2013-12-13 ENCOUNTER — Encounter (HOSPITAL_COMMUNITY): Payer: Self-pay | Admitting: *Deleted

## 2015-06-03 ENCOUNTER — Ambulatory Visit (INDEPENDENT_AMBULATORY_CARE_PROVIDER_SITE_OTHER): Payer: Self-pay | Admitting: Family Medicine

## 2015-06-03 VITALS — BP 112/70 | HR 120 | Temp 100.5°F | Resp 18 | Ht 61.0 in | Wt 151.4 lb

## 2015-06-03 DIAGNOSIS — Z0189 Encounter for other specified special examinations: Secondary | ICD-10-CM

## 2015-06-03 DIAGNOSIS — Z8639 Personal history of other endocrine, nutritional and metabolic disease: Secondary | ICD-10-CM

## 2015-06-03 DIAGNOSIS — B349 Viral infection, unspecified: Secondary | ICD-10-CM

## 2015-06-03 DIAGNOSIS — R51 Headache: Secondary | ICD-10-CM

## 2015-06-03 DIAGNOSIS — R509 Fever, unspecified: Secondary | ICD-10-CM

## 2015-06-03 LAB — POCT CBC
GRANULOCYTE PERCENT: 89.3 % — AB (ref 37–80)
HEMATOCRIT: 36.2 % — AB (ref 37.7–47.9)
Hemoglobin: 12.8 g/dL (ref 12.2–16.2)
Lymph, poc: 0.8 (ref 0.6–3.4)
MCH: 31.6 pg — AB (ref 27–31.2)
MCHC: 35.4 g/dL (ref 31.8–35.4)
MCV: 89.1 fL (ref 80–97)
MID (CBC): 0.6 (ref 0–0.9)
MPV: 6.6 fL (ref 0–99.8)
PLATELET COUNT, POC: 277 10*3/uL (ref 142–424)
POC GRANULOCYTE: 11.1 — AB (ref 2–6.9)
POC LYMPH PERCENT: 6.2 %L — AB (ref 10–50)
POC MID %: 4.5 %M (ref 0–12)
RBC: 4.07 M/uL (ref 4.04–5.48)
RDW, POC: 13.7 %
WBC: 12.4 10*3/uL — AB (ref 4.6–10.2)

## 2015-06-03 LAB — POCT URINALYSIS DIP (MANUAL ENTRY)
BILIRUBIN UA: NEGATIVE
Blood, UA: NEGATIVE
GLUCOSE UA: NEGATIVE
Ketones, POC UA: NEGATIVE
LEUKOCYTES UA: NEGATIVE
NITRITE UA: NEGATIVE
Protein Ur, POC: 30 — AB
Spec Grav, UA: 1.015
UROBILINOGEN UA: 1
pH, UA: 8.5

## 2015-06-03 LAB — POC MICROSCOPIC URINALYSIS (UMFC)

## 2015-06-03 LAB — POCT INFLUENZA A/B
INFLUENZA A, POC: NEGATIVE
Influenza B, POC: NEGATIVE

## 2015-06-03 LAB — GLUCOSE, POCT (MANUAL RESULT ENTRY): POC Glucose: 112 mg/dl — AB (ref 70–99)

## 2015-06-03 MED ORDER — ACETAMINOPHEN 325 MG PO TABS
1000.0000 mg | ORAL_TABLET | Freq: Once | ORAL | Status: AC
  Administered 2015-06-03: 975 mg via ORAL

## 2015-06-03 NOTE — Patient Instructions (Addendum)
Plenty of fluids and get enough rest.   Take acetaminophen 500 mg pills 3 times daily as needed for fever or ibuprofen 600 mg 3 times daily.  The tests look okay except for a slightly increased white blood count which goes along with having an infection. There is no infection in your urine. If you get sicker, more fever, pain, coughing more, more headache, or other problems please return.  A virus infection like this can cause headache, body pain, fever, and can develop into more of a runny nose or cough or vomiting or diarrhea. Antibiotics will not help a virus, but we need to know if something else develops.  I will let you know the results of the rest of your tests in a few days  IF you received an x-ray today, you will receive an invoice from [facility]. Please contact [facility] at [phone] with questions or concerns regarding your invoice.   IF you received labwork today, you will receive an invoice from [facility]. Please contact [facility] at [phone] with questions or concerns regarding your invoice.   Our billing staff will not be able to assist you with questions regarding bills from these companies.  You will be contacted with the lab results as soon as they are available. The fastest way to get your results is to activate your My Chart account. Instructions are located on the last page of this paperwork. If you have not heard from us regarding the results in 2 weeks, please contact this office.

## 2015-06-03 NOTE — Progress Notes (Signed)
Patient ID: Jacqueline Serrano, female    DOB: Jul 09, 1970  Age: 45 y.o. MRN: [id number]  Chief Complaint  Patient presents with  . Chest Pain  . Dizziness  . Abdominal Pain  . Headache    Subjective:   Patient started getting ill last night. This morning she had fever and headache and neck hurt some. No one else sick. She says nobody else is sick at home. She has been sick at the stomach but not vomiting. She has had some numbness in her fingertips. She felt dizzy.  She is requesting that I check her lipids and sugar and blood chemistries. She had some elevated sugar when she was pregnant. She also wants was told there was a problem with her liver.  Current allergies, medications, problem list, past/family and social histories reviewed.  Objective:  BP 112/70 mmHg  Pulse 120  Temp(Src) 100.5 F (38.1 C) (Oral)  Resp 18  Ht  (1.549 m)  Wt 151 lb 6.4 oz (68.675 kg)  BMI 28.62 kg/m2  SpO2 97%  LMP 03/29/2015 Ill-appearing lady. Her TMs are normal. Throat clear. Neck supple without nodes. Chest is clear to auscultation. Heart regular without murmur. Body feels very warm to touch. Abdomen soft without mass or tenderness. Assessment & Plan:   Assessment: 1. Viral syndrome   2. History of hyperglycemia   3. Fever, unspecified fever cause   4. Patient request for diagnostic testing       Plan: Check labs. Give her some Tylenol for the fever. I retook the temperature was 100.5.  Orders Placed This Encounter  Procedures  . Lipid panel  . Comprehensive metabolic panel  . POCT CBC  . POCT glucose (manual entry)  . POCT Influenza A/B    Meds ordered this encounter  Medications  . acetaminophen (TYLENOL) tablet 975 mg    Sig:     Results for orders placed or performed in visit on 06/03/15  POCT CBC  Result Value Ref Range   WBC 12.4 (A) 4.6 - 10.2 K/uL   Lymph, poc 0.8 0.6 - 3.4   POC LYMPH PERCENT 6.2 (A) 10 - 50 %L   MID (cbc) 0.6 0 - 0.9   POC MID % 4.5 0  - 12 %M   POC Granulocyte 11.1 (A) 2 - 6.9   Granulocyte percent 89.3 (A) 37 - 80 %G   RBC 4.07 4.04 - 5.48 M/uL   Hemoglobin 12.8 12.2 - 16.2 g/dL   HCT, POC 86.5 (A) 78.4 - 47.9 %   MCV 89.1 80 - 97 fL   MCH, POC 31.6 (A) 27 - 31.2 pg   MCHC 35.4 31.8 - 35.4 g/dL   RDW, POC 69.6 %   Platelet Count, POC 277 142 - 424 K/uL   MPV 6.6 0 - 99.8 fL  POCT glucose (manual entry)  Result Value Ref Range   POC Glucose 112 (A) 70 - 99 mg/dl  POCT Influenza A/B  Result Value Ref Range   Influenza A, POC Negative Negative   Influenza B, POC Negative Negative   s    Patient Instructions   Plenty of fluids and get enough rest.   Take acetaminophen 500 mg pills 3 times daily as needed for fever or ibuprofen 600 mg 3 times daily.  The tests look okay except for a slightly increased white blood count which goes along with having an infection. If you get sicker, more fever, pain, coughing more, more headache, or other problems please return.  A virus infection like this can cause headache, body pain, fever, and can develop into more of a runny nose or cough or vomiting or diarrhea. Antibiotics will not help a virus, but we need to know if something else develops.  I will let you know the results of the rest of your tests in a few days  IF you received an x-ray today, you will receive an invoice from [facility]. Please contact [facility] at [phone] with questions or concerns regarding your invoice.   IF you received labwork today, you will receive an invoice from [facility]. Please contact [facility] at [phone] with questions or concerns regarding your invoice.   Our billing staff will not be able to assist you with questions regarding bills from these companies.  You will be contacted with the lab results as soon as they are available. The fastest way to get your results is to activate your My Chart account. Instructions are located on the  last page of this paperwork. If you have not heard from us regarding the results in 2 weeks, please contact this office.          Return if symptoms worsen or fail to improve.   HOPPER,DAVID, MD 06/03/2015

## 2015-06-04 LAB — LIPID PANEL
CHOL/HDL RATIO: 3.8 ratio (ref <=5.0)
Cholesterol: 177 mg/dL (ref 125–200)
HDL: 47 mg/dL (ref >=46)
LDL CALC: 109 mg/dL (ref <130)
Triglycerides: 107 mg/dL (ref <150)
VLDL: 21 mg/dL (ref <30)

## 2015-06-04 LAB — COMPREHENSIVE METABOLIC PANEL
ALBUMIN: 4.1 g/dL (ref 3.6–5.1)
ALT: 53 U/L — ABNORMAL HIGH (ref 6–29)
AST: 36 U/L — AB (ref 10–30)
Alkaline Phosphatase: 69 U/L (ref 33–115)
BUN: 12 mg/dL (ref 7–25)
CHLORIDE: 100 mmol/L (ref 98–110)
CO2: 26 mmol/L (ref 20–31)
CREATININE: 0.64 mg/dL (ref 0.50–1.10)
Calcium: 8.9 mg/dL (ref 8.6–10.2)
GLUCOSE: 107 mg/dL — AB (ref 65–99)
Potassium: 4.2 mmol/L (ref 3.5–5.3)
SODIUM: 135 mmol/L (ref 135–146)
Total Bilirubin: 0.6 mg/dL (ref 0.2–1.2)
Total Protein: 8.2 g/dL — ABNORMAL HIGH (ref 6.1–8.1)

## 2015-06-05 ENCOUNTER — Ambulatory Visit (INDEPENDENT_AMBULATORY_CARE_PROVIDER_SITE_OTHER): Payer: Self-pay | Admitting: Family Medicine

## 2015-06-05 VITALS — BP 116/70 | HR 100 | Temp 99.1°F | Resp 18 | Ht 61.0 in | Wt 148.8 lb

## 2015-06-05 DIAGNOSIS — R7989 Other specified abnormal findings of blood chemistry: Secondary | ICD-10-CM

## 2015-06-05 DIAGNOSIS — R1032 Left lower quadrant pain: Secondary | ICD-10-CM

## 2015-06-05 DIAGNOSIS — R110 Nausea: Secondary | ICD-10-CM

## 2015-06-05 DIAGNOSIS — R945 Abnormal results of liver function studies: Secondary | ICD-10-CM

## 2015-06-05 LAB — HEPATIC FUNCTION PANEL
ALT: 40 U/L — ABNORMAL HIGH (ref 6–29)
AST: 30 U/L (ref 10–30)
Albumin: 4.2 g/dL (ref 3.6–5.1)
Alkaline Phosphatase: 63 U/L (ref 33–115)
Bilirubin, Direct: 0.1 mg/dL
Indirect Bilirubin: 0.4 mg/dL (ref 0.2–1.2)
Total Bilirubin: 0.5 mg/dL (ref 0.2–1.2)
Total Protein: 8 g/dL (ref 6.1–8.1)

## 2015-06-05 LAB — POCT CBC
GRANULOCYTE PERCENT: 77.8 % (ref 37–80)
HEMATOCRIT: 39.6 % (ref 37.7–47.9)
Hemoglobin: 14.1 g/dL (ref 12.2–16.2)
Lymph, poc: 1.5 (ref 0.6–3.4)
MCH, POC: 31.7 pg — AB (ref 27–31.2)
MCHC: 35.7 g/dL — AB (ref 31.8–35.4)
MCV: 88.9 fL (ref 80–97)
MID (CBC): 0.2 (ref 0–0.9)
MPV: 6.4 fL (ref 0–99.8)
POC GRANULOCYTE: 5.8 (ref 2–6.9)
POC LYMPH %: 19.5 % (ref 10–50)
POC MID %: 2.7 % (ref 0–12)
Platelet Count, POC: 247 10*3/uL (ref 142–424)
RBC: 4.45 M/uL (ref 4.04–5.48)
RDW, POC: 13.3 %
WBC: 7.5 10*3/uL (ref 4.6–10.2)

## 2015-06-05 MED ORDER — ONDANSETRON 4 MG PO TBDP
8.0000 mg | ORAL_TABLET | Freq: Once | ORAL | Status: AC
  Administered 2015-06-05: 8 mg via ORAL

## 2015-06-05 MED ORDER — ONDANSETRON 8 MG PO TBDP: 8.0000 mg | ORAL_TABLET | Freq: Three times a day (TID) | ORAL | Status: DC | PRN

## 2015-06-05 NOTE — Progress Notes (Signed)
Subjective:    Patient ID: Jacqueline Serrano, female    DOB: December 23, 1970, 45 y.o.   MRN: [id number]  HPI This is a pleasant 45 yo female who is accompanied by her husband who is assiting with translating. The patient presents today with 2 days of left sided abdominal pain. She was seen 3 days ago with muscle aches and dizziness. These symptoms have gotten better. Abdominal pain is intermittent, off and on all day. Has never had pain like this before. LMP 05/26/15 and lasted 7 days. Uses condoms for birth control. Menses have been irregular.  Decreased appetite. Able to keep some liquids down. Last BM 2 days ago. Had diarrhea two days ago. Took tylenol with some relief of fever and pain. No dysuria or hematuria. She vomited x 2 yesterday.   Past Medical History  Diagnosis Date  . Diabetes mellitus     gestational diet controlled   Past Surgical History  Procedure Laterality Date  . Cesarean section      2 previous   History reviewed. No pertinent family history. Social History  Substance Use Topics  . Smoking status: Never Smoker   . Smokeless tobacco: None  . Alcohol Use: None     Review of Systems Per HPI    Objective:   Physical Exam  Constitutional: She is oriented to person, place, and time. She appears well-developed and well-nourished. She appears ill. No distress.  HENT:  Head: Normocephalic and atraumatic.  Eyes: Conjunctivae are normal.  Cardiovascular: Normal rate, regular rhythm and normal heart sounds.   Pulmonary/Chest: Effort normal and breath sounds normal.  Abdominal: Soft. Bowel sounds are normal. She exhibits no distension. There is tenderness (LLQ>RLQ). There is no rebound and no guarding.  Genitourinary: Cervix exhibits no motion tenderness and no discharge. Right adnexum displays tenderness. Right adnexum displays no mass and no fullness. Left adnexum displays mass (approximately 2 cm firmness palpated on bimanual exam.) and tenderness.    Musculoskeletal: Normal range of motion.  Neurological: She is alert and oriented to person, place, and time.  Skin: Skin is warm and dry. She is not diaphoretic.  Psychiatric: She has a normal mood and affect. Her behavior is normal. Judgment and thought content normal.  Vitals reviewed.     BP 116/70 mmHg  Pulse 100  Temp(Src) 99.1 F (37.3 C) (Oral)  Resp 18  Ht  (1.549 m)  Wt 148 lb 12.8 oz (67.495 kg)  BMI 28.13 kg/m2  SpO2 99%  LMP 03/29/2015 Wt Readings from Last 3 Encounters:  06/05/15 148 lb 12.8 oz (67.495 kg)  06/03/15 151 lb 6.4 oz (68.675 kg)  12/25/10 153 lb (69.4 kg)   Results for orders placed or performed in visit on 06/05/15  POCT CBC  Result Value Ref Range   WBC 7.5 4.6 - 10.2 K/uL   Lymph, poc 1.5 0.6 - 3.4   POC LYMPH PERCENT 19.5 10 - 50 %L   MID (cbc) 0.2 0 - 0.9   POC MID % 2.7 0 - 12 %M   POC Granulocyte 5.8 2 - 6.9   Granulocyte percent 77.8 37 - 80 %G   RBC 4.45 4.04 - 5.48 M/uL   Hemoglobin 14.1 12.2 - 16.2 g/dL   HCT, POC 40.9 81.1 - 47.9 %   MCV 88.9 80 - 97 fL   MCH, POC 31.7 (A) 27 - 31.2 pg   MCHC 35.7 (A) 31.8 - 35.4 g/dL   RDW, POC 91.4 %   Platelet Count,  POC 247 142 - 424 K/uL   MPV 6.4 0 - 99.8 fL   Given ondansetron 8 mg in office with improvement of nausea.     Assessment & Plan:  Discussed with Dr. Alwyn RenHopper 1. Left lower quadrant pain - diverticulitis vs. Ovarian cyst - POCT CBC- WBC normal today - Hepatic Function Panel - US Transvaginal Non-OB; Future - bland diet, increase fluids - RTC precautions- fever over 101, increased pain, persistent vomiting  2. Elevated LFTs - POCT CBC - Hepatic Function Panel  3. Nausea without vomiting - ondansetron (ZOFRAN-ODT) disintegrating tablet 8 mg; Take 2 tablets (8 mg total) by mouth once. - ondansetron (ZOFRAN-ODT) 8 MG disintegrating tablet; Take 1 tablet (8 mg total) by mouth every 8 (eight) hours as needed for nausea.  Dispense: 20 tablet; Refill: 0   Olean Reeeborah  Teira Arcilla, FNP-BC  [facility], [facility]  06/05/2015 8:06 PM

## 2015-06-05 NOTE — Patient Instructions (Addendum)
Please continue to take tylenol every 8 hours for pain, can also try ibuprofen 2 tablets every 8 hours for pain We will call you about an appointment for an ultrasound of your abdomen If you start vomiting and can't stop or if you have worsening pain, please come back in or go to emergency room Eat bland foods and drink plenty of liquids    IF you received an x-ray today, you will receive an invoice from [facility]. Please contact [facility] at [phone] with questions or concerns regarding your invoice.   IF you received labwork today, you will receive an invoice from [facility]. Please contact [facility] at [phone] with questions or concerns regarding your invoice.   Our billing staff will not be able to assist you with questions regarding bills from these companies.  You will be contacted with the lab results as soon as they are available. The fastest way to get your results is to activate your My Chart account. Instructions are located on the last page of this paperwork. If you have not heard from us regarding the results in 2 weeks, please contact this office.

## 2015-06-06 ENCOUNTER — Telehealth: Payer: Self-pay

## 2015-06-06 NOTE — Progress Notes (Signed)
Discussed in detail with Deboraha Sprangebbie Gessner, FNP.  I had previously seen the patient.  Did not reexamine myself today.  Discussed interval history, exam, and decided on labs.  WBC okay.  Treatment plan agreed upon.  Sandria Balesavid H. Alwyn RenHopper MD

## 2015-06-06 NOTE — Telephone Encounter (Signed)
[facility] needs an order added for the patient to coincide with the ultrasound transvaginal that was already placed.  Please add an order for an ultrasound pelvis complete.  CB#: [phone]

## 2015-06-08 ENCOUNTER — Other Ambulatory Visit: Payer: Self-pay | Admitting: Family Medicine

## 2015-06-08 DIAGNOSIS — R1030 Lower abdominal pain, unspecified: Secondary | ICD-10-CM

## 2015-06-08 NOTE — Telephone Encounter (Signed)
Looks like order was placed by [employer]bie

## 2015-06-09 ENCOUNTER — Telehealth: Payer: Self-pay

## 2015-06-09 NOTE — Telephone Encounter (Signed)
Gurney MaxinMike Mani, PA-C left detailed VM in spanish informing LFT's are improving and US is scheduled 5/3 at [facility].

## 2015-06-09 NOTE — Telephone Encounter (Signed)
-----   Message from Emi Belfasteborah B Gessner, FNP sent at 06/08/2015  8:16 AM EDT ----- Please call patient or her husband (patient Spanish speaking) and let them know that her liver tests are improved. She should be getting call about scheduling her ultra sound today.

## 2015-06-14 ENCOUNTER — Ambulatory Visit
Admission: RE | Admit: 2015-06-14 | Discharge: 2015-06-14 | Disposition: A | Discharge status: Home / Self Care | Payer: Medicaid Other | Source: Ambulatory Visit | Attending: Family Medicine | Admitting: Family Medicine

## 2015-06-14 DIAGNOSIS — R1030 Lower abdominal pain, unspecified: Secondary | ICD-10-CM

## 2015-06-14 DIAGNOSIS — R1032 Left lower quadrant pain: Secondary | ICD-10-CM

## 2015-06-15 ENCOUNTER — Telehealth: Payer: Self-pay

## 2015-06-15 NOTE — Telephone Encounter (Signed)
IC spoke with husband.  Gave message per Eunice Blaseebbie re: normal U/S pelvic. Pt advised pt continues in pain.  Advised to return to clinic for follow up. Pt's husband understood and will bring pt back in.  Debbie overheard conversation.

## 2015-06-15 NOTE — Telephone Encounter (Signed)
-----   Message from Emi Belfasteborah B Gessner, FNP sent at 06/14/2015 10:10 PM EDT ----- Please call patient's husband (patient is spanish speaking) and tell him that her ultrasound was normal. Please return for a recheck if she is still having pain.

## 2015-10-04 ENCOUNTER — Telehealth: Payer: Self-pay

## 2015-10-04 NOTE — Telephone Encounter (Signed)
The clinic next door to us (didnt catch the name) request all records for this patient to be faxed to them at [phone].  They called MR voicemail on Thursday 09/28/15 at 457pm

## 2017-03-01 IMAGING — US US TRANSVAGINAL NON-OB
1 series · 14 of 25 positions shown · non-contrast
Comparison: All prior ultrasounds were performed for obstetrical
evaluation

CLINICAL DATA: Left lower quadrant abdominal and pelvic pain for 3
days

EXAM:
TRANSABDOMINAL AND TRANSVAGINAL ULTRASOUND OF PELVIS
TECHNIQUE: Both transabdominal and transvaginal ultrasound examinations of the
pelvis were performed. Transabdominal technique was performed for
global imaging of the pelvis including uterus, ovaries, adnexal
regions, and pelvic cul-de-sac. It was necessary to proceed with
endovaginal exam following the transabdominal exam to visualize the
adnexal regions.

[Series 1: us transvaginal non-ob · 0.21mm/px · 14 of 64 slices shown]
[im 1/64]
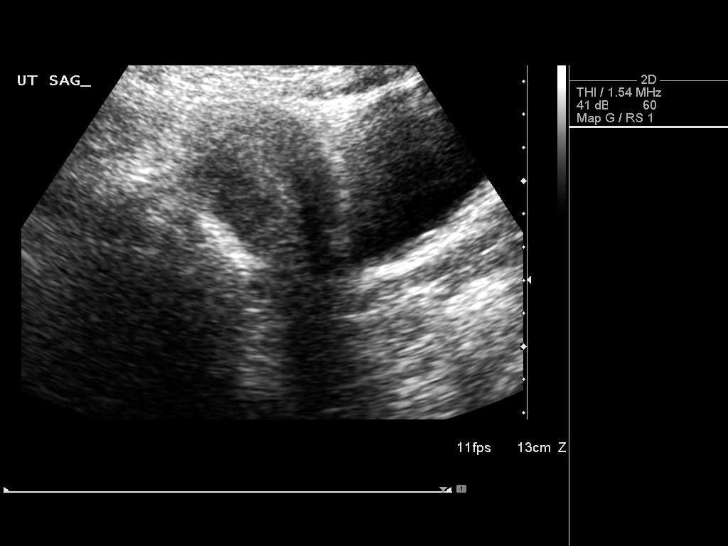
[im 6/64]
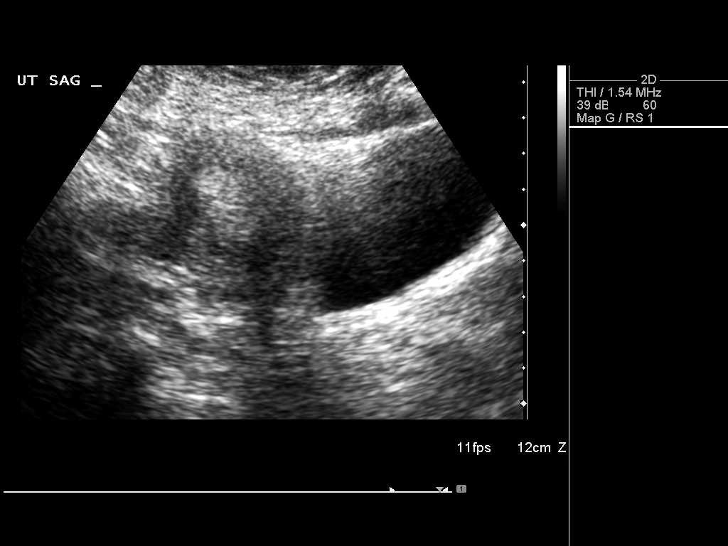
[im 11/64]
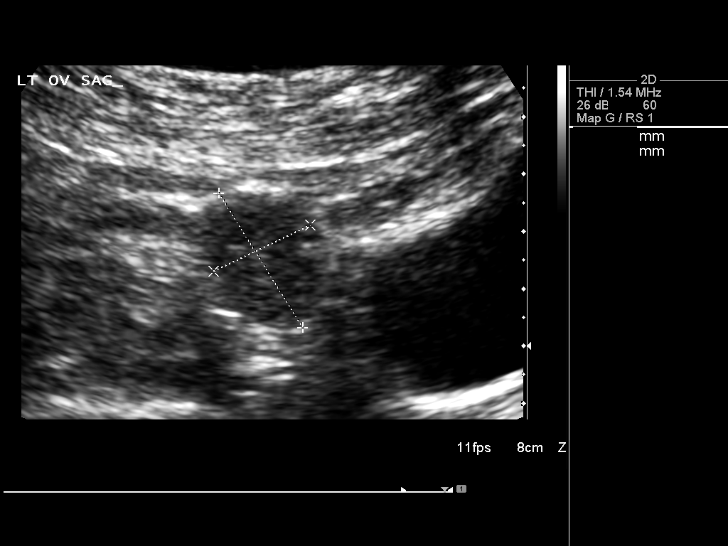
[im 16/64]
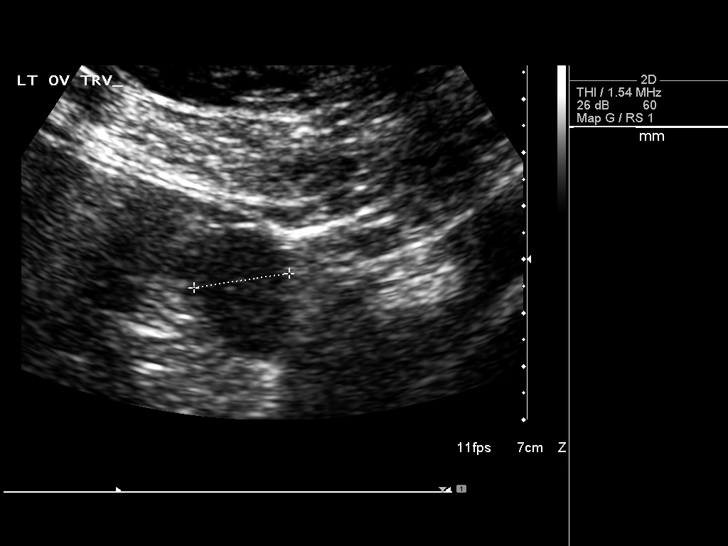
[im 22/64]
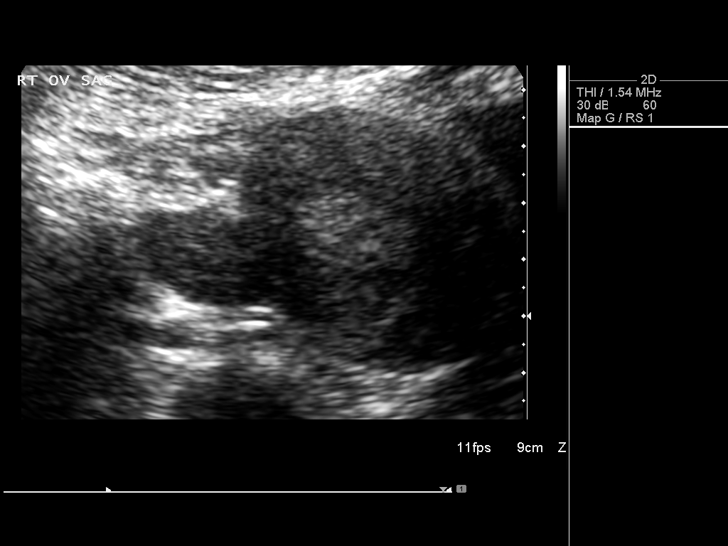
[im 24/64]
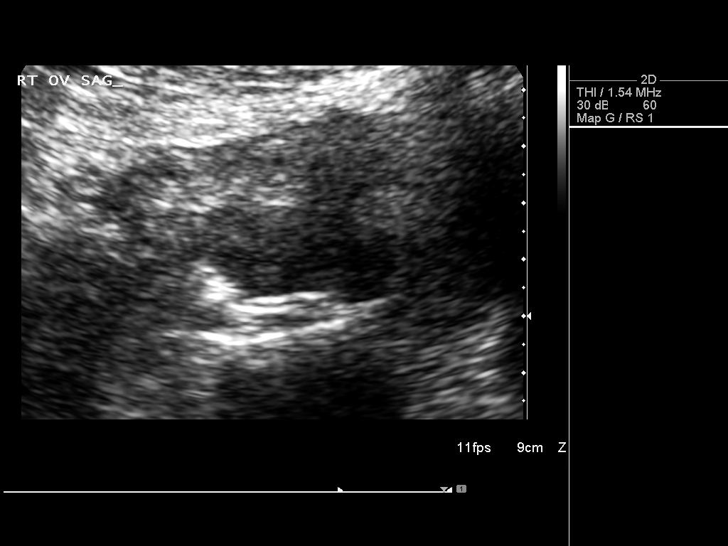
[im 29/64]
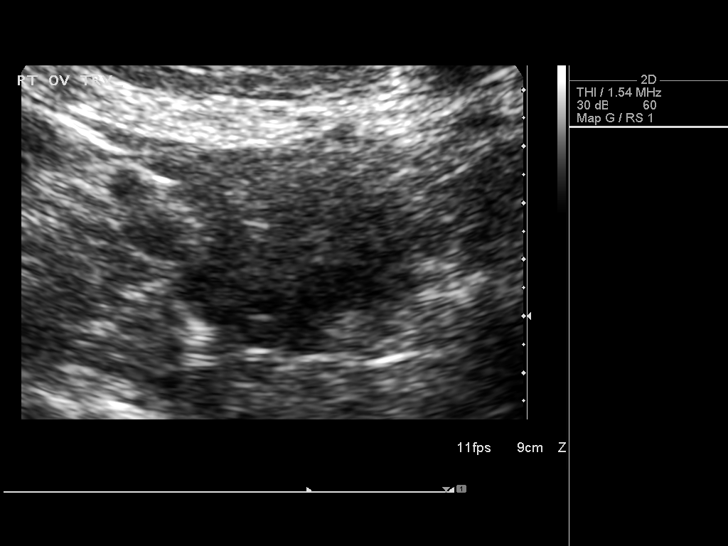
[im 35/64]
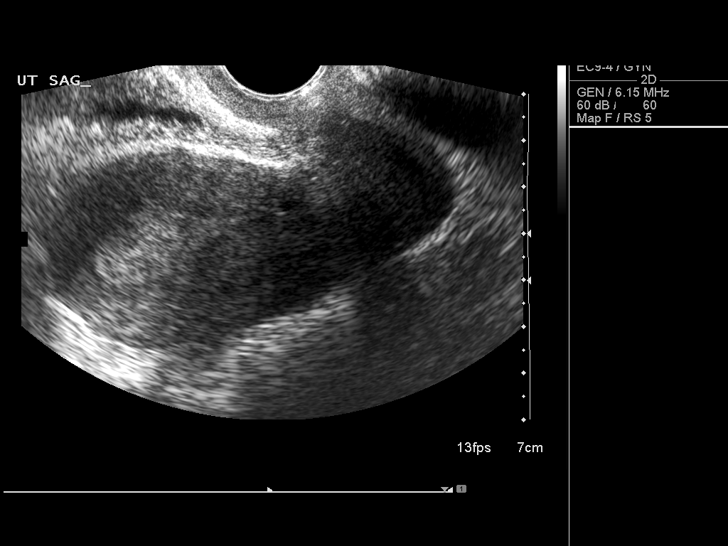
[im 40/64]
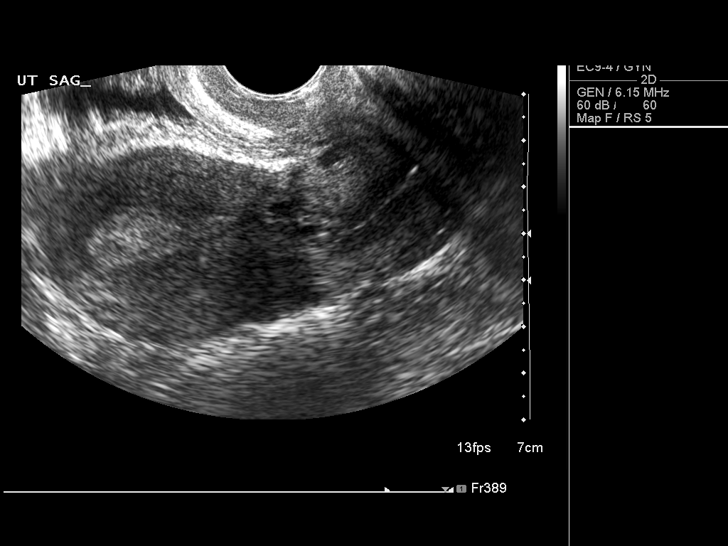
[im 43/64]
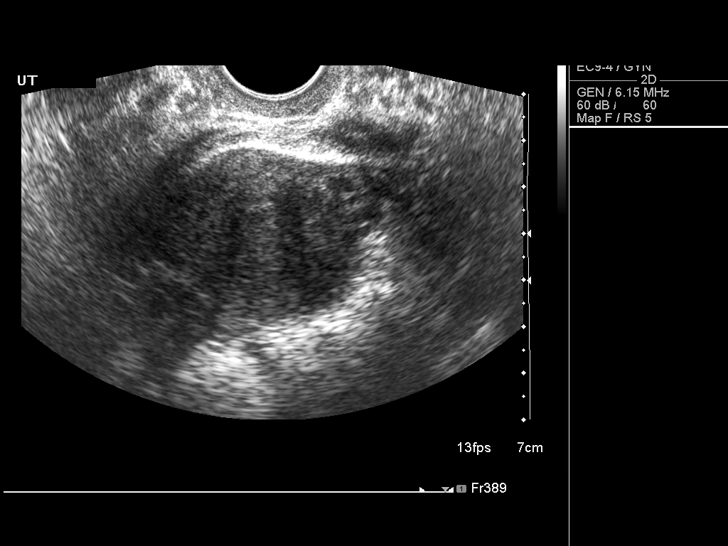
[im 48/64]
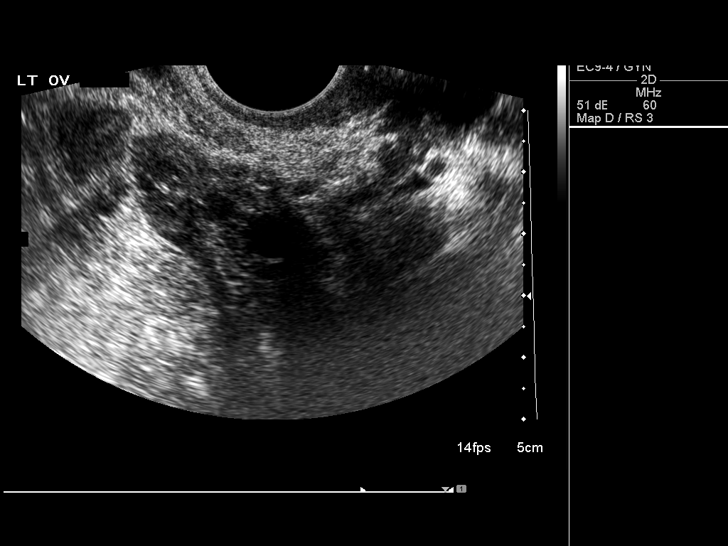
[im 53/64]
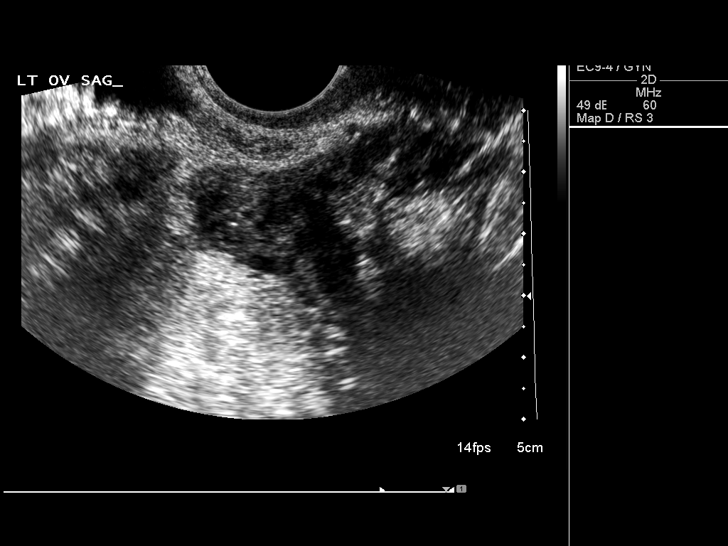
[im 58/64]
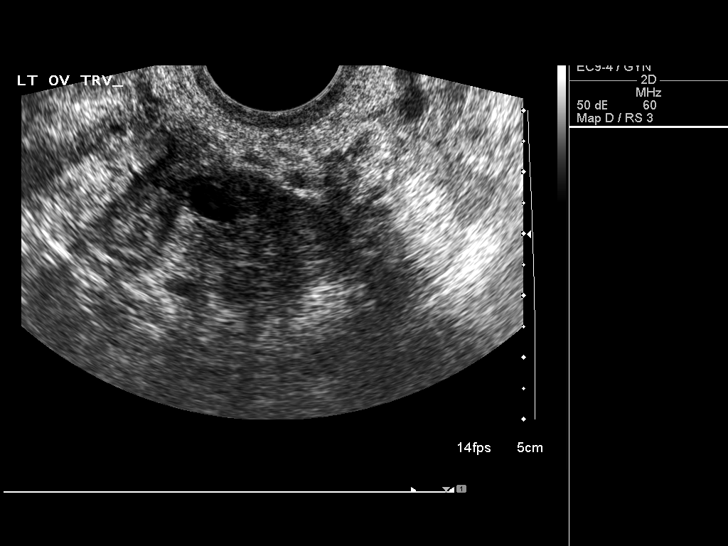
[im 64/64]
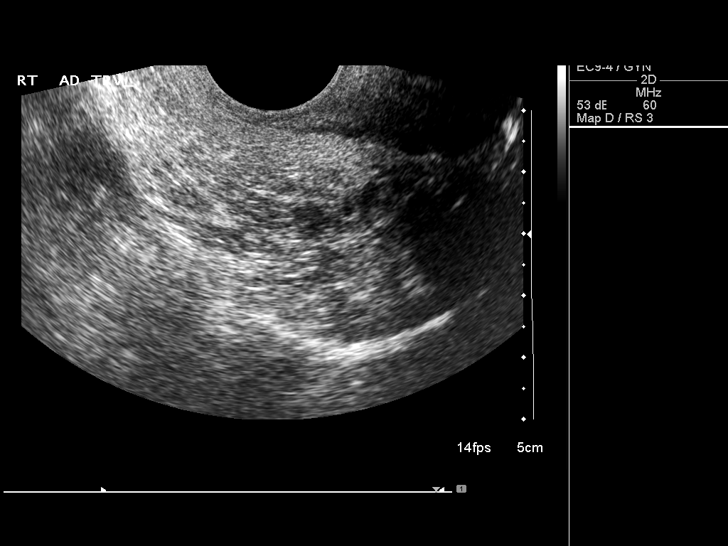

[14 of 25 positions shown; findings below may reference images not displayed]

FINDINGS: Uterus

Measurements: The uterus measures 9.0 x 4.3 x 5.6 cm.. No myometrial
abnormality is seen.

Endometrium

Thickness: The endometrium measures 15.4 mm in thickness.. No
abnormality is seen.

Right ovary

Measurements: The right ovary measures 2.8 x 2.0 x 1.8 cm.. No
significant abnormality is seen.

Left ovary

Measurements: The left ovary measures 3.7 x 2.1 x 2.2 cm.. No
abnormality is noted.

Other findings

No abnormal free fluid.
IMPRESSION: Negative ultrasound of the pelvis.

## 2019-01-13 ENCOUNTER — Other Ambulatory Visit: Payer: Self-pay

## 2019-01-14 NOTE — Progress Notes (Signed)
Name: Jacqueline Serrano  MRN/ DOB: [id number], [birth year]    Age/ Sex: 48 y.o., female    PCP: No primary care provider on file.   Reason for Endocrinology Evaluation: Hyperthyroidism      Date of Initial Endocrinology Evaluation: 01/15/2019     HPI: [name] is a 48 y.o. female with unremarkable  past medical history. The patient presented for initial endocrinology clinic visit on 01/15/2019 for consultative assistance with her hypthyroidism   Pt was diagnosed with hyperthyroidism in 10/2018  After presenting to her PCp with symptoms or weight loss, headaches, heat intolerance, tremors and some irritability and fatigue. Her TSh was suppressed at < 0.005 uIU/mL. She was initially started on Methimazole 5 mg daily by her PCP, which was increased to 10 in October, 2020  She denies any side effects to methimazole   She initially noted in her symptoms but the last 2-3 weeks she is having some minimal symptoms again with headaches.   She has occasional  No diarrhea at the time but has constipation   Denies abdominal pain or vomiting while on methimazole and no fever.   Initially her Hyperthyroid symptoms improved with methimazole but 2 weeks ago some of the symptoms recurred .   Denies local neck symptoms but has slight dysphagia.    Nephew with thyroid disease.     HISTORY:  Past Medical History:  Past Medical History:  Diagnosis Date  . Diabetes mellitus    gestational diet controlled   Past Surgical History:  Past Surgical History:  Procedure Laterality Date  . CESAREAN SECTION     2 previous      Social History:  reports that she has never smoked. She does not have any smokeless tobacco history on file.  Family History: family history is not on file.   HOME MEDICATIONS: Allergies as of 01/15/2019   No Known Allergies     Medication List       Accurate as of January 15, 2019  7:53 AM. If you have any questions, ask your nurse or  doctor.        ondansetron 8 MG disintegrating tablet Commonly known as: ZOFRAN-ODT Take 1 tablet (8 mg total) by mouth every 8 (eight) hours as needed for nausea.   prenatal vitamin w/FE, FA 27-1 MG Tabs tablet Take 1 tablet by mouth daily.         REVIEW OF SYSTEMS: A comprehensive ROS was conducted with the patient and is negative except as per HPI and below:  ROS     OBJECTIVE:  VS: There were no vitals taken for this visit.   Wt Readings from Last 3 Encounters:  06/05/15 148 lb 12.8 oz (67.5 kg)  06/03/15 151 lb 6.4 oz (68.7 kg)  12/25/10 153 lb (69.4 kg)     EXAM: General: Pt appears well and is in NAD  Hydration: Well-hydrated with moist mucous membranes and good skin turgor  Eyes: External eye exam normal without stare, lid lag or exophthalmos.  EOM intact.  PERRL.  Ears, Nose, Throat: Hearing: Grossly intact bilaterally Dental: Good dentition  Throat: Clear without mass, erythema or exudate  Neck: General: Supple without adenopathy. Thyroid: Thyroid size normal.  No goiter or nodules appreciated. No thyroid bruit.  Lungs: Clear with good BS bilat with no rales, rhonchi, or wheezes  Heart: Auscultation: RRR.  Abdomen: Normoactive bowel sounds, soft, nontender, without masses or organomegaly palpable  Extremities: Gait and station: Normal gait  Digits and nails:  No clubbing, cyanosis, petechiae, or nodes Head and neck: Normal alignment and mobility BL UE: Normal ROM and strength. BL LE: No pretibial edema normal ROM and strength.  Skin: Hair: Texture and amount normal with gender appropriate distribution Skin Inspection: No rashes, acanthosis nigricans/skin tags. No lipohypertrophy Skin Palpation: Skin temperature, texture, and thickness normal to palpation  Neuro: Cranial nerves: II - XII grossly intact  Cerebellar: Normal coordination and movement; no tremor Motor: Normal strength throughout DTRs: 2+ and symmetric in UE without delay in relaxation phase   Mental Status: Judgment, insight: Intact Orientation: Oriented to time, place, and person Memory: Intact for recent and remote events Mood and affect: No depression, anxiety, or agitation     DATA REVIEWED: 11/02/18  TSH < 0.005 uIU/mL    12/08/18 TSH < 0.005 uIU/mL   Results for Jacqueline Serrano (MRN [id number]) as of 01/18/2019 10:46  Ref. Range 01/15/2019 11:15  Sodium Latest Ref Range: 135 - 145 mEq/L 139  Potassium Latest Ref Range: 3.5 - 5.1 mEq/L 4.2  Chloride Latest Ref Range: 96 - 112 mEq/L 103  CO2 Latest Ref Range: 19 - 32 mEq/L 29  Glucose Latest Ref Range: 70 - 99 mg/dL 081 (H)  BUN Latest Ref Range: 6 - 23 mg/dL 15  Creatinine Latest Ref Range: 0.40 - 1.20 mg/dL 4.48  Calcium Latest Ref Range: 8.4 - 10.5 mg/dL 9.5  Alkaline Phosphatase Latest Ref Range: 39 - 117 U/L 192 (H)  Albumin Latest Ref Range: 3.5 - 5.2 g/dL 4.1  AST Latest Ref Range: 0 - 37 U/L 17  ALT Latest Ref Range: 0 - 35 U/L 21  Total Protein Latest Ref Range: 6.0 - 8.3 g/dL 7.9  Total Bilirubin Latest Ref Range: 0.2 - 1.2 mg/dL 0.3  GFR Latest Ref Range: >60.00 mL/min 171.44  WBC Latest Ref Range: 4.0 - 10.5 K/uL 6.6  RBC Latest Ref Range: 3.87 - 5.11 Mil/uL 4.60  Hemoglobin Latest Ref Range: 12.0 - 15.0 g/dL 18.5  HCT Latest Ref Range: 36.0 - 46.0 % 40.6  MCV Latest Ref Range: 78.0 - 100.0 fl 88.3  MCHC Latest Ref Range: 30.0 - 36.0 g/dL 63.1  RDW Latest Ref Range: 11.5 - 15.5 % 12.0  Platelets Latest Ref Range: 150.0 - 400.0 K/uL 312.0  Neutrophils Latest Ref Range: 43.0 - 77.0 % 52.3  Lymphocytes Latest Ref Range: 12.0 - 46.0 % 37.6  Monocytes Relative Latest Ref Range: 3.0 - 12.0 % 6.8  Eosinophil Latest Ref Range: 0.0 - 5.0 % 2.6  Basophil Latest Ref Range: 0.0 - 3.0 % 0.7  NEUT# Latest Ref Range: 1.4 - 7.7 K/uL 3.4  Lymphocyte # Latest Ref Range: 0.7 - 4.0 K/uL 2.5  Monocyte # Latest Ref Range: 0.1 - 1.0 K/uL 0.4  Eosinophils Absolute Latest Ref Range: 0.0 - 0.7 K/uL 0.2   Basophils Absolute Latest Ref Range: 0.0 - 0.1 K/uL 0.0  TSH Latest Ref Range: 0.35 - 4.50 uIU/mL <0.01 (L)  T4,Free(Direct) Latest Ref Range: 0.60 - 1.60 ng/dL 4.97 (H)    Thyroid Ultrasound   Heterogenous echo texture with increased vascularity     ASSESSMENT/PLAN/RECOMMENDATIONS:   1. Hyperthyroidism:   - Discussed D/D of graves' disease vs toxic nodule(s)  - Clinically she is euthyroid  - We discussed that Graves' Disease is a result of an autoimmune condition involving the thyroid.   - We discussed with pt the benefits of methimazole in the Tx of hyperthyroidism, as well as the possible side effects/complications of anti-thyroid drug Tx (  specifically detailing the rare, but serious side effect of agranulocytosis). She was informed of need for regular thyroid function monitoring while on methimazole to ensure appropriate dosage without over-treatment. As well, we discussed the possible side effects of methimazole including the chance of rash, the small chance of liver irritation/juandice and the <=1 in 300-400 chance of sudden onset agranulocytosis.  We discussed importance of going to ED promptly (and stopping methimazole) if shewere to develop significant fever with severe sore throat of other evidence of acute infection.     - We extensively discussed the various treatment options for hyperthyroidism and Graves disease including ablation therapy with radioactive iodine versus antithyroid drug treatment versus surgical therapy.  We recommended to the patient that we felt, at this time, that thionamide therapy would be most optimal.  We discussed the various possible benefits versus side effects of the various therapies.    Medications : Increase Methimazole 10 mg, to 2 tablets daily   2. Elevated Alkaline Phosphatase:   - I doubt that this is due to methimazole, I suspect this is most likely related to increased bone resorption due to hyperthyroidism.     F/U in 3 months    Labs in 6 weeks   Addendum: Results discussed with spouse 01/18/2019 at 1045am   Signed electronically by: Mack Guise, MD  [facility]  [address] [facility] [address] [place], [place] [address] Phone: [phone] FAX: [phone]   CC: No primary care provider on file. No primary provider on file. Phone: None Fax: None   Return to Endocrinology clinic as below: Future Appointments  Date Time Provider [place]  01/15/2019 10:30 AM Shamleffer, Melanie Crazier, MD [facility] None

## 2019-01-15 ENCOUNTER — Ambulatory Visit (INDEPENDENT_AMBULATORY_CARE_PROVIDER_SITE_OTHER): Payer: Self-pay | Admitting: Internal Medicine

## 2019-01-15 ENCOUNTER — Other Ambulatory Visit: Payer: Self-pay

## 2019-01-15 ENCOUNTER — Encounter: Payer: Self-pay | Admitting: Internal Medicine

## 2019-01-15 VITALS — BP 122/72 | HR 98 | Temp 98.1°F | Ht 61.0 in | Wt 135.6 lb

## 2019-01-15 DIAGNOSIS — E0590 Thyrotoxicosis, unspecified without thyrotoxic crisis or storm: Secondary | ICD-10-CM

## 2019-01-15 LAB — CBC WITH DIFFERENTIAL/PLATELET
Basophils Absolute: 0 10*3/uL (ref 0.0–0.1)
Basophils Relative: 0.7 % (ref 0.0–3.0)
Eosinophils Absolute: 0.2 10*3/uL (ref 0.0–0.7)
Eosinophils Relative: 2.6 % (ref 0.0–5.0)
HCT: 40.6 % (ref 36.0–46.0)
Hemoglobin: 13.8 g/dL (ref 12.0–15.0)
Lymphocytes Relative: 37.6 % (ref 12.0–46.0)
Lymphs Abs: 2.5 10*3/uL (ref 0.7–4.0)
MCHC: 34.1 g/dL (ref 30.0–36.0)
MCV: 88.3 fl (ref 78.0–100.0)
Monocytes Absolute: 0.4 10*3/uL (ref 0.1–1.0)
Monocytes Relative: 6.8 % (ref 3.0–12.0)
Neutro Abs: 3.4 10*3/uL (ref 1.4–7.7)
Neutrophils Relative %: 52.3 % (ref 43.0–77.0)
Platelets: 312 10*3/uL (ref 150.0–400.0)
RBC: 4.6 Mil/uL (ref 3.87–5.11)
RDW: 12 % (ref 11.5–15.5)
WBC: 6.6 10*3/uL (ref 4.0–10.5)

## 2019-01-15 LAB — COMPREHENSIVE METABOLIC PANEL
ALT: 21 U/L (ref 0–35)
AST: 17 U/L (ref 0–37)
Albumin: 4.1 g/dL (ref 3.5–5.2)
Alkaline Phosphatase: 192 U/L — ABNORMAL HIGH (ref 39–117)
BUN: 15 mg/dL (ref 6–23)
CO2: 29 mEq/L (ref 19–32)
Calcium: 9.5 mg/dL (ref 8.4–10.5)
Chloride: 103 mEq/L (ref 96–112)
Creatinine, Ser: 0.4 mg/dL (ref 0.40–1.20)
GFR: 171.44 mL/min (ref >60.00)
Glucose, Bld: 108 mg/dL — ABNORMAL HIGH (ref 70–99)
Potassium: 4.2 mEq/L (ref 3.5–5.1)
Sodium: 139 mEq/L (ref 135–145)
Total Bilirubin: 0.3 mg/dL (ref 0.2–1.2)
Total Protein: 7.9 g/dL (ref 6.0–8.3)

## 2019-01-15 LAB — T4, FREE: Free T4: 2.92 ng/dL — ABNORMAL HIGH (ref 0.60–1.60)

## 2019-01-15 LAB — TSH: TSH: <0.01 u[IU]/mL — ABNORMAL LOW (ref 0.35–4.50)

## 2019-01-15 NOTE — Patient Instructions (Signed)
We recommend that you follow these hyperthyroidism instructions at home:  1) Take Methimazole 10 mg once a day  If you develop severe sore throat with high fevers OR develop unexplained yellowing of your skin, eyes, under your tongue, severe abdominal pain with nausea or vomiting --> then please get evaluated immediately.  2) Get repeat thyroid labs in 6 weeks .   It is ESSENTIAL to get follow-up labs to help avoid over or undertreatment of your hyperthyroidism - both of which can be dangerous to your health.   

## 2019-01-18 ENCOUNTER — Telehealth: Payer: Self-pay | Admitting: Internal Medicine

## 2019-01-18 ENCOUNTER — Encounter: Payer: Self-pay | Admitting: Internal Medicine

## 2019-01-18 DIAGNOSIS — E0590 Thyrotoxicosis, unspecified without thyrotoxic crisis or storm: Secondary | ICD-10-CM | POA: Insufficient documentation

## 2019-01-18 MED ORDER — METHIMAZOLE 10 MG PO TABS: 20.0000 mg | ORAL_TABLET | Freq: Every day | ORAL | 3 refills | Status: DC

## 2019-01-18 NOTE — Telephone Encounter (Signed)
Discussed TFT's with spouse    Recommendations   Increase methimazole 10 mg, 2 tabs daily     Abby Nena Jordan, MD  [facility]  [facility] [address] [place], [place] [address] Phone: [phone] FAX: [phone]

## 2019-01-20 LAB — TRAB (TSH RECEPTOR BINDING ANTIBODY): TRAB: 9.49 IU/L — ABNORMAL HIGH (ref <=2.00)

## 2019-01-21 ENCOUNTER — Encounter: Payer: Self-pay | Admitting: Internal Medicine

## 2019-02-26 ENCOUNTER — Other Ambulatory Visit: Payer: Self-pay

## 2019-02-26 ENCOUNTER — Other Ambulatory Visit (INDEPENDENT_AMBULATORY_CARE_PROVIDER_SITE_OTHER): Payer: Self-pay

## 2019-02-26 DIAGNOSIS — E0590 Thyrotoxicosis, unspecified without thyrotoxic crisis or storm: Secondary | ICD-10-CM

## 2019-02-26 LAB — COMPREHENSIVE METABOLIC PANEL
ALT: 15 U/L (ref 0–35)
AST: 17 U/L (ref 0–37)
Albumin: 4.1 g/dL (ref 3.5–5.2)
Alkaline Phosphatase: 233 U/L — ABNORMAL HIGH (ref 39–117)
BUN: 15 mg/dL (ref 6–23)
CO2: 27 mEq/L (ref 19–32)
Calcium: 9.3 mg/dL (ref 8.4–10.5)
Chloride: 103 mEq/L (ref 96–112)
Creatinine, Ser: 0.48 mg/dL (ref 0.40–1.20)
GFR: 138.84 mL/min (ref >60.00)
Glucose, Bld: 112 mg/dL — ABNORMAL HIGH (ref 70–99)
Potassium: 3.8 mEq/L (ref 3.5–5.1)
Sodium: 139 mEq/L (ref 135–145)
Total Bilirubin: 0.3 mg/dL (ref 0.2–1.2)
Total Protein: 8 g/dL (ref 6.0–8.3)

## 2019-02-26 LAB — TSH: TSH: <0.01 u[IU]/mL — ABNORMAL LOW (ref 0.35–4.50)

## 2019-02-26 LAB — T4, FREE: Free T4: 1.3 ng/dL (ref 0.60–1.60)

## 2019-03-01 ENCOUNTER — Encounter: Payer: Self-pay | Admitting: Internal Medicine

## 2019-03-02 ENCOUNTER — Other Ambulatory Visit: Payer: Self-pay

## 2019-04-15 ENCOUNTER — Other Ambulatory Visit: Payer: Self-pay

## 2019-04-19 ENCOUNTER — Ambulatory Visit (INDEPENDENT_AMBULATORY_CARE_PROVIDER_SITE_OTHER): Payer: Self-pay | Admitting: Internal Medicine

## 2019-04-19 ENCOUNTER — Encounter: Payer: Self-pay | Admitting: Internal Medicine

## 2019-04-19 ENCOUNTER — Other Ambulatory Visit: Payer: Self-pay

## 2019-04-19 VITALS — BP 118/78 | HR 84 | Temp 98.8°F | Ht 61.0 in | Wt 151.0 lb

## 2019-04-19 DIAGNOSIS — E0590 Thyrotoxicosis, unspecified without thyrotoxic crisis or storm: Secondary | ICD-10-CM

## 2019-04-19 LAB — T4, FREE: Free T4: 0.63 ng/dL (ref 0.60–1.60)

## 2019-04-19 LAB — TSH: TSH: <0.01 u[IU]/mL — ABNORMAL LOW (ref 0.35–4.50)

## 2019-04-19 NOTE — Patient Instructions (Addendum)
We recommend that you follow these hyperthyroidism instructions at home:  1) Take Methimazole 10 mg, 2 tablets  once a day  If you develop severe sore throat with high fevers OR develop unexplained yellowing of your skin, eyes, under your tongue, severe abdominal pain with nausea or vomiting --> then please get evaluated immediately.

## 2019-04-19 NOTE — Progress Notes (Signed)
Name: Jacqueline Serrano  MRN/ DOB: [id number], [birth year]    Age/ Sex: 50 y.o., female     PCP: Patient, No Pcp Per   Reason for Endocrinology Evaluation: Hyperthyroidism     Initial Endocrinology Clinic Visit: 01/15/2019    PATIENT IDENTIFIER: Jacqueline Serrano is a 49 y.o., female with a past medical history of hyperthyroidism. She has followed with [facility] since 01/15/2019  for consultative assistance with management of her hyperthyroidism.   HISTORICAL SUMMARY: The patient was first diagnosed with hyperthyroidism in 10/2018  After presenting to her PCp with symptoms or weight loss, headaches, heat intolerance, tremors and some irritability and fatigue. Her TSh was suppressed at < 0.005 uIU/mL. She was initially started on Methimazole 5 mg daily by her PCP, which was increased to 10 in October, 2020  SUBJECTIVE:   During last visit (01/15/2019): Increased Methimazole dose  Today (04/19/2019):  Jacqueline Serrano is here for a f/u on hyperthyroidism.  She has been gaining weight  Denies  Constipation Denies depression   Denies any local neck symptoms    Feels bumps on the roof of the mouth and ear issues.     ROS:  As per HPI.   HISTORY:  Past Medical History:  Past Medical History:  Diagnosis Date  . Diabetes mellitus    gestational diet controlled   Past Surgical History:  Past Surgical History:  Procedure Laterality Date  . CESAREAN SECTION     2 previous    Social History:  reports that she has never smoked. She has never used smokeless tobacco. She reports that she does not drink alcohol or use drugs. Family History:  Family History  Problem Relation Age of Onset  . Diabetes Mother   . Hypertension Sister      HOME MEDICATIONS: Allergies as of 04/19/2019   No Known Allergies     Medication List       Accurate as of April 19, 2019 10:32 AM. If you have any questions, ask your nurse or doctor.        methimazole 10  MG tablet Commonly known as: TAPAZOLE Take 2 tablets (20 mg total) by mouth daily.         OBJECTIVE:   PHYSICAL EXAM: VS: BP 118/78 (BP Location: Left Arm, Patient Position: Sitting, Cuff Size: Large)   Pulse 84   Temp 98.8 F (37.1 C)   Ht 5\' 1"  (1.549 m)   Wt 151 lb (68.5 kg)   SpO2 98%   BMI 28.53 kg/m    EXAM: General: Pt appears well and is in NAD  Eyes: External eye exam normal without stare, lid lag or exophthalmos.  EOM intact.    Ears, Nose, Throat: Hearing: Grossly intact bilaterally Dental: Good dentition  Throat: Clear without mass, erythema or exudate  Neck: General: Supple without adenopathy. Thyroid: Thyroid size is prominent.  No nodules appreciated.   Lungs: Clear with good BS bilat with no rales, rhonchi, or wheezes  Heart: Auscultation: RRR.  Abdomen: Normoactive bowel sounds, soft, nontender, without masses or organomegaly palpable  Extremities:  BL LE: No pretibial edema normal ROM and strength.  Skin: Hair: Texture and amount normal with gender appropriate distribution Skin Inspection: No rashes Skin Palpation: Skin temperature, texture, and thickness normal to palpation  Mental Status: Judgment, insight: Intact Orientation: Oriented to time, place, and person Mood and affect: No depression, anxiety, or agitation     DATA REVIEWED: Results for Jacqueline, Serrano (MRN [id number]) as of 04/20/2019  08:32  Ref. Range 04/19/2019 10:45  TSH Latest Ref Range: 0.35 - 4.50 uIU/mL <0.01 (L)  T4,Free(Direct) Latest Ref Range: 0.60 - 1.60 ng/dL 2.03     5/59/74  TSH < 0.005 uIU/mL    12/08/18 TSH < 0.005 uIU/mL    Results for Jacqueline, Serrano (MRN [id number]) as of 04/19/2019 09:32  Ref. Range 01/15/2019 11:15  TRAB Latest Ref Range: <=2.00 IU/L 9.49 (H)     ASSESSMENT / PLAN / RECOMMENDATIONS:   1. Hyperthyroidism Secondary to Graves' Disease:   -Patient is clinically euthyroid -No local neck symptoms -No side effects to  methimazole -Based on her TFTs from today we will reduce her methimazole dose as below    Medications   Methimazole 10 mg, 1 and 1/2 tablets daily    Follow-up in 3 months  Addendum: Attempted to call the patient on 04/20/2019 at 8:30 AM, no answer, a voicemail was left for her to call back.   Signed electronically by: Lyndle Herrlich, MD  [facility] End[facility]in[facility]  [facility] [address] [place], [place] [address] Phone: [phone] FAX: [phone]      CC: Patient, No Pcp Per No address on file Phone: None  Fax: None   Return to Endocrinology clinic as below: No future appointments.

## 2019-04-20 ENCOUNTER — Encounter: Payer: Self-pay | Admitting: Internal Medicine

## 2019-04-20 ENCOUNTER — Telehealth: Payer: Self-pay

## 2019-04-20 MED ORDER — METHIMAZOLE 10 MG PO TABS: 15.0000 mg | ORAL_TABLET | Freq: Every day | ORAL | 3 refills | Status: DC

## 2019-04-20 NOTE — Telephone Encounter (Signed)
Pt was returning call, I read results and instructions from letter that was mailed out to pt and pt state that she understood instructions.

## 2019-07-14 ENCOUNTER — Other Ambulatory Visit: Payer: Self-pay

## 2019-07-16 NOTE — Progress Notes (Signed)
Name: Jacqueline Serrano  MRN/ DOB: [id number], October 23, 1970    Age/ Sex: 49 y.o., female     PCP: Patient, No Pcp Per   Reason for Endocrinology Evaluation: Hyperthyroidism     Initial Endocrinology Clinic Visit: 01/15/2019    PATIENT IDENTIFIER: Jacqueline Serrano is a 49 y.o., female with a past medical history of hyperthyroidism. She has followed with [facility] since 01/15/2019  for consultative assistance with management of her hyperthyroidism.   HISTORICAL SUMMARY: The patient was first diagnosed with hyperthyroidism in 10/2018  After presenting to her PCp with symptoms or weight loss, headaches, heat intolerance, tremors and some irritability and fatigue. Her TSh was suppressed at < 0.005 uIU/mL. She was initially started on Methimazole 5 mg daily by her PCP, which was increased to 10 in October, 2020  SUBJECTIVE:    Today (07/19/2019):  [name] is here for a f/u on hyperthyroidism.  Weight stable Denies vomiting or abdominal  Denies  Constipation, or diarrhea   Denies any local neck symptoms    Denies fatigue but has been sleeping too much  Has light sensitivity since reducing methimazole to 1.5 tabs   Methimazole 1.5 tabs daily   ROS:  As per HPI.   HISTORY:  Past Medical History:  Past Medical History:  Diagnosis Date  . Diabetes mellitus    gestational diet controlled   Past Surgical History:  Past Surgical History:  Procedure Laterality Date  . CESAREAN SECTION     2 previous    Social History:  reports that she has never smoked. She has never used smokeless tobacco. She reports that she does not drink alcohol or use drugs. Family History:  Family History  Problem Relation Age of Onset  . Diabetes Mother   . Hypertension Sister      HOME MEDICATIONS: Allergies as of 07/19/2019   No Known Allergies     Medication List       Accurate as of July 19, 2019  2:16 PM. If you have any questions, ask your nurse or  doctor.        methimazole 10 MG tablet Commonly known as: TAPAZOLE Take 1.5 tablets (15 mg total) by mouth daily.         OBJECTIVE:   PHYSICAL EXAM: VS: BP 116/70 (BP Location: Left Arm, Patient Position: Sitting, Cuff Size: Normal)   Pulse 88   Temp 98.2 F (36.8 C) (Oral)   Ht 5\' 1"  (1.549 m)   Wt 154 lb 6.4 oz (70 kg)   SpO2 97%   BMI 29.17 kg/m    EXAM: General: Pt appears well and is in NAD  Eyes: External eye exam normal without stare, lid lag or exophthalmos.  EOM intact.    Neck: General: Supple without adenopathy. Thyroid: Thyroid size is prominent.  No nodules appreciated.   Lungs: Clear with good BS bilat with no rales, rhonchi, or wheezes  Heart: Auscultation: RRR.  Abdomen: Normoactive bowel sounds, soft, nontender, without masses or organomegaly palpable  Extremities:  BL LE: No pretibial edema normal ROM and strength.  Mental Status: Judgment, insight: Intact Orientation: Oriented to time, place, and person Mood and affect: No depression, anxiety, or agitation     DATA REVIEWED:  Results for SIDNEE, GAMBRILL (MRN [id number]) as of 07/19/2019 14:16  Ref. Range 07/19/2019 10:06  TSH Latest Ref Range: 0.35 - 4.50 uIU/mL 0.02 (L)  T4,Free(Direct) Latest Ref Range: 0.60 - 1.60 ng/dL 0.99    Results for MEJIA-MARTINEZ, Dandrea (  MRN [id number]) as of 04/19/2019 09:32  Ref. Range 01/15/2019 11:15  TRAB Latest Ref Range: <=2.00 IU/L 9.49 (H)     ASSESSMENT / PLAN / RECOMMENDATIONS:   1. Hyperthyroidism Secondary to Graves' Disease:   -Patient is clinically euthyroid -No local neck symptoms -No side effects to methimazole -Based on her TFTs from today will continue current methimazole dose as below    Medications   Methimazole 10 mg, 1 and 1/2 tablets daily  2. Graves' Disease:   - No extra-thyroidal manifestations of Graves' disease. Pt advised to schedule an eye exam and notify provider of graves' disease as her eye symptoms could be  related      Follow-up in 4 months   Signed electronically by: Lyndle Herrlich, MD  [facility] Endocrinology  [facility] [address] [place] [place], [place] [address] Phone: [phone] FAX: [phone]      CC: Patient, No Pcp Per No address on file Phone: None  Fax: None   Return to Endocrinology clinic as below: Future Appointments  Date Time Provider Department Center  11/22/2019  9:50 AM Jaspal Pultz, Konrad Dolores, MD [facility] None

## 2019-07-19 ENCOUNTER — Other Ambulatory Visit: Payer: Self-pay

## 2019-07-19 ENCOUNTER — Ambulatory Visit (INDEPENDENT_AMBULATORY_CARE_PROVIDER_SITE_OTHER): Payer: Self-pay | Admitting: Internal Medicine

## 2019-07-19 ENCOUNTER — Encounter: Payer: Self-pay | Admitting: Internal Medicine

## 2019-07-19 VITALS — BP 116/70 | HR 88 | Temp 98.2°F | Ht 61.0 in | Wt 154.4 lb

## 2019-07-19 DIAGNOSIS — E0590 Thyrotoxicosis, unspecified without thyrotoxic crisis or storm: Secondary | ICD-10-CM

## 2019-07-19 DIAGNOSIS — E0500 Thyrotoxicosis with diffuse goiter without thyrotoxic crisis or storm: Secondary | ICD-10-CM | POA: Insufficient documentation

## 2019-07-19 LAB — T4, FREE: Free T4: 0.99 ng/dL (ref 0.60–1.60)

## 2019-07-19 LAB — TSH: TSH: 0.02 u[IU]/mL — ABNORMAL LOW (ref 0.35–4.50)

## 2019-07-19 MED ORDER — METHIMAZOLE 10 MG PO TABS: 15.0000 mg | ORAL_TABLET | Freq: Every day | ORAL | 3 refills | Status: DC

## 2019-07-19 NOTE — Patient Instructions (Signed)
We recommend that you follow these hyperthyroidism instructions at home:  1) Take Methimazole 10 mg, one and a half  tablets  once a day- until you hear otherwise from us   If you develop severe sore throat with high fevers OR develop unexplained yellowing of your skin, eyes, under your tongue, severe abdominal pain with nausea or vomiting --> then please get evaluated immediately.     Le recomendamos que siga estas instrucciones de hipertiroidismo en casa:  1) Tome metimazol 10 mg, una tableta y media una vez al da, hasta que tenga noticias nuestras Si desarrolla dolor de garganta severo con fiebre alta O desarrolla un color amarillento inexplicable de la piel, los ojos, debajo de la lengua, dolor abdominal intenso con nuseas o vmitos, entonces hgase una evaluacin de inmediato 

## 2019-10-14 ENCOUNTER — Telehealth: Payer: Self-pay | Admitting: Internal Medicine

## 2019-10-14 ENCOUNTER — Other Ambulatory Visit: Payer: Self-pay

## 2019-10-14 MED ORDER — METHIMAZOLE 10 MG PO TABS: 15.0000 mg | ORAL_TABLET | Freq: Every day | ORAL | 0 refills | Status: DC

## 2019-10-14 NOTE — Telephone Encounter (Signed)
Rx sent 

## 2019-10-14 NOTE — Telephone Encounter (Signed)
Medication Refill Request  Did you call your pharmacy and request this refill first? Yes  . If patient has not contacted pharmacy first, instruct them to do so for future refills.  . Remind them that contacting the pharmacy for their refill is the quickest method to get the refill.  . Refill policy also stated that it will take anywhere between 24-72 hours to receive the refill.    Name of medication? methimazole  Is this a 90 day supply? yes  Name and location of pharmacy?  [facility] [address] ([address]), [place] - [address] Phone:  [phone]  Fax:  [phone]

## 2019-11-22 ENCOUNTER — Ambulatory Visit (INDEPENDENT_AMBULATORY_CARE_PROVIDER_SITE_OTHER): Payer: Self-pay | Admitting: Internal Medicine

## 2019-11-22 ENCOUNTER — Other Ambulatory Visit: Payer: Self-pay

## 2019-11-22 VITALS — BP 122/78 | HR 79 | Wt 164.0 lb

## 2019-11-22 DIAGNOSIS — E0500 Thyrotoxicosis with diffuse goiter without thyrotoxic crisis or storm: Secondary | ICD-10-CM

## 2019-11-22 DIAGNOSIS — R748 Abnormal levels of other serum enzymes: Secondary | ICD-10-CM

## 2019-11-22 DIAGNOSIS — E0590 Thyrotoxicosis, unspecified without thyrotoxic crisis or storm: Secondary | ICD-10-CM

## 2019-11-22 LAB — CBC WITH DIFFERENTIAL/PLATELET
Basophils Absolute: 0.1 10*3/uL (ref 0.0–0.1)
Basophils Relative: 0.8 % (ref 0.0–3.0)
Eosinophils Absolute: 0.2 10*3/uL (ref 0.0–0.7)
Eosinophils Relative: 2.2 % (ref 0.0–5.0)
HCT: 42.2 % (ref 36.0–46.0)
Hemoglobin: 14.7 g/dL (ref 12.0–15.0)
Lymphocytes Relative: 43.4 % (ref 12.0–46.0)
Lymphs Abs: 3.1 10*3/uL (ref 0.7–4.0)
MCHC: 34.8 g/dL (ref 30.0–36.0)
MCV: 93.8 fl (ref 78.0–100.0)
Monocytes Absolute: 0.4 10*3/uL (ref 0.1–1.0)
Monocytes Relative: 5 % (ref 3.0–12.0)
Neutro Abs: 3.5 10*3/uL (ref 1.4–7.7)
Neutrophils Relative %: 48.6 % (ref 43.0–77.0)
Platelets: 299 10*3/uL (ref 150.0–400.0)
RBC: 4.5 Mil/uL (ref 3.87–5.11)
RDW: 12.2 % (ref 11.5–15.5)
WBC: 7.3 10*3/uL (ref 4.0–10.5)

## 2019-11-22 LAB — COMPREHENSIVE METABOLIC PANEL
ALT: 40 U/L — ABNORMAL HIGH (ref 0–35)
AST: 25 U/L (ref 0–37)
Albumin: 4.4 g/dL (ref 3.5–5.2)
Alkaline Phosphatase: 135 U/L — ABNORMAL HIGH (ref 39–117)
BUN: 12 mg/dL (ref 6–23)
CO2: 30 mEq/L (ref 19–32)
Calcium: 9.4 mg/dL (ref 8.4–10.5)
Chloride: 101 mEq/L (ref 96–112)
Creatinine, Ser: 0.56 mg/dL (ref 0.40–1.20)
GFR: 110.74 mL/min (ref >60.00)
Glucose, Bld: 108 mg/dL — ABNORMAL HIGH (ref 70–99)
Potassium: 4 mEq/L (ref 3.5–5.1)
Sodium: 138 mEq/L (ref 135–145)
Total Bilirubin: 0.5 mg/dL (ref 0.2–1.2)
Total Protein: 8.6 g/dL — ABNORMAL HIGH (ref 6.0–8.3)

## 2019-11-22 LAB — T4, FREE: Free T4: 0.69 ng/dL (ref 0.60–1.60)

## 2019-11-22 LAB — TSH: TSH: 0.73 u[IU]/mL (ref 0.35–4.50)

## 2019-11-22 NOTE — Progress Notes (Signed)
Name: Jacqueline Serrano  MRN/ DOB: [id number], 1970/12/02    Age/ Sex: 49 y.o., female     PCP: Patient, No Pcp Per   Reason for Endocrinology Evaluation: Hyperthyroidism     Initial Endocrinology Clinic Visit: 01/15/2019    PATIENT IDENTIFIER: Jacqueline Serrano is a 49 y.o., female with a past medical history of hyperthyroidism. She has followed with [facility] since 01/15/2019  for consultative assistance with management of her hyperthyroidism.   HISTORICAL SUMMARY: The patient was first diagnosed with hyperthyroidism in 10/2018  After presenting to her PCp with symptoms or weight loss, headaches, heat intolerance, tremors and some irritability and fatigue. Her TSh was suppressed at < 0.005 uIU/mL. She was initially started on Methimazole 5 mg daily by her PCP, which was increased to 10 in October, 2020  SUBJECTIVE:    Today (11/22/2019):  Jacqueline Serrano is here for a f/u on hyperthyroidism.  She has been gaining weight  Had abdominal pain last week with diarrhea but this has resolved  In the morning she has early morning phlegm  Denies any local neck symptoms     Methimazole 1.5 tabs daily     HISTORY:  Past Medical History:  Past Medical History:  Diagnosis Date  . Diabetes mellitus    gestational diet controlled   Past Surgical History:  Past Surgical History:  Procedure Laterality Date  . CESAREAN SECTION     2 previous    Social History:  reports that she has never smoked. She has never used smokeless tobacco. She reports that she does not drink alcohol and does not use drugs. Family History:  Family History  Problem Relation Age of Onset  . Diabetes Mother   . Hypertension Sister      HOME MEDICATIONS: Allergies as of 11/22/2019   No Known Allergies     Medication List       Accurate as of November 22, 2019  7:21 AM. If you have any questions, ask your nurse or doctor.        methimazole 10 MG tablet Commonly  known as: TAPAZOLE Take 1.5 tablets (15 mg total) by mouth daily.         OBJECTIVE:   PHYSICAL EXAM: VS: There were no vitals taken for this visit.   EXAM: General: Pt appears well and is in NAD  Eyes: External eye exam normal without stare, lid lag or exophthalmos.  EOM intact.    Neck: General: Supple without adenopathy. Thyroid: Thyroid size is prominent.  No nodules appreciated.   Lungs: Clear with good BS bilat with no rales, rhonchi, or wheezes  Heart: Auscultation: RRR.  Abdomen: Normoactive bowel sounds, soft, nontender, without masses or organomegaly palpable  Extremities:  BL LE: No pretibial edema normal ROM and strength.  Mental Status: Judgment, insight: Intact Orientation: Oriented to time, place, and person Mood and affect: No depression, anxiety, or agitation     DATA REVIEWED: Results for [name], [name] (MRN [id number]) as of 11/24/2019 07:44  Ref. Range 11/22/2019 10:27  Sodium Latest Ref Range: 135 - 145 mEq/L 138  Potassium Latest Ref Range: 3.5 - 5.1 mEq/L 4.0  Chloride Latest Ref Range: 96 - 112 mEq/L 101  CO2 Latest Ref Range: 19 - 32 mEq/L 30  Glucose Latest Ref Range: 70 - 99 mg/dL 108 (H)  BUN Latest Ref Range: 6 - 23 mg/dL 12  Creatinine Latest Ref Range: 0.40 - 1.20 mg/dL 0.56  Calcium Latest Ref Range: 8.4 - 10.5 mg/dL  9.4  Alkaline Phosphatase Latest Ref Range: 39 - 117 U/L 135 (H)  Albumin Latest Ref Range: 3.5 - 5.2 g/dL 4.4  AST Latest Ref Range: 0 - 37 U/L 25  ALT Latest Ref Range: 0 - 35 U/L 40 (H)  Total Protein Latest Ref Range: 6.0 - 8.3 g/dL 8.6 (H)  Total Bilirubin Latest Ref Range: 0.2 - 1.2 mg/dL 0.5  GFR Latest Ref Range: >60.00 mL/min 110.74  WBC Latest Ref Range: 4.0 - 10.5 K/uL 7.3  RBC Latest Ref Range: 3.87 - 5.11 Mil/uL 4.50  Hemoglobin Latest Ref Range: 12.0 - 15.0 g/dL 14.7  HCT Latest Ref Range: 36 - 46 % 42.2  MCV Latest Ref Range: 78.0 - 100.0 fl 93.8  MCHC Latest Ref Range: 30.0 - 36.0 g/dL 34.8  RDW  Latest Ref Range: 11.5 - 15.5 % 12.2  Platelets Latest Ref Range: 150 - 400 K/uL 299.0  Neutrophils Latest Ref Range: 43 - 77 % 48.6  Lymphocytes Latest Ref Range: 12 - 46 % 43.4  Monocytes Relative Latest Ref Range: 3 - 12 % 5.0  Eosinophil Latest Ref Range: 0 - 5 % 2.2  Basophil Latest Ref Range: 0 - 3 % 0.8  NEUT# Latest Ref Range: 1.4 - 7.7 K/uL 3.5  Lymphocyte # Latest Ref Range: 0.7 - 4.0 K/uL 3.1  Monocyte # Latest Ref Range: 0.1 - 1.0 K/uL 0.4  Eosinophils Absolute Latest Ref Range: 0.0 - 0.7 K/uL 0.2  Basophils Absolute Latest Ref Range: 0.0 - 0.1 K/uL 0.1  TSH Latest Ref Range: 0.35 - 4.50 uIU/mL 0.73  T4,Free(Direct) Latest Ref Range: 0.60 - 1.60 ng/dL 0.69   Results for [name], [name] (MRN [id number]) as of 04/19/2019 09:32  Ref. Range 01/15/2019 11:15  TRAB Latest Ref Range: <=2.00 IU/L 9.49 (H)     ASSESSMENT / PLAN / RECOMMENDATIONS:   1. Hyperthyroidism Secondary to Graves' Disease:   -Patient is clinically euthyroid -No local neck symptoms -No side effects to methimazole - Repeat TFT's are normal    Medications   Methimazole 10 mg, 1 and 1/2 tablets daily  2. Graves' Disease:   - No extra-thyroidal manifestations of Graves' disease. Pt advised to schedule an eye exam and notify provider of graves' disease as her eye symptoms could be related    3. Elevated Liver enzymes:   - Her Alk Phos is trending down but her ALT is elevated, no intervention, will continue to monitor.   Follow-up in 4 months   Signed electronically by: Mack Guise, MD  [facility] Endocrin[facility]  [facility] [address] [place], [place] [address] Phone: [phone] FAX: [phone]      CC: Patient, No Pcp Per No address on file Phone: None  Fax: None   Return to Endocrinology clinic as below: Future Appointments  Date Time Provider [place]  11/22/2019  9:50 AM Jacqueline Serrano, Melanie Crazier, MD [facility] None

## 2019-11-22 NOTE — Patient Instructions (Signed)
We recommend that you follow these hyperthyroidism instructions at home:  1) Take Methimazole 10 mg, one and a half  tablets  once a day- until you hear otherwise from us   If you develop severe sore throat with high fevers OR develop unexplained yellowing of your skin, eyes, under your tongue, severe abdominal pain with nausea or vomiting --> then please get evaluated immediately.     Le recomendamos que siga estas instrucciones de hipertiroidismo en casa:  1) Tome metimazol 10 mg, una tableta y media una vez al da, hasta que tenga noticias nuestras Si desarrolla dolor de garganta severo con fiebre alta O desarrolla un color amarillento inexplicable de la piel, los ojos, debajo de la lengua, dolor abdominal intenso con nuseas o vmitos, entonces hgase una evaluacin de inmediato 

## 2019-11-24 ENCOUNTER — Encounter: Payer: Self-pay | Admitting: Internal Medicine

## 2019-11-24 DIAGNOSIS — R748 Abnormal levels of other serum enzymes: Secondary | ICD-10-CM | POA: Insufficient documentation

## 2020-01-17 ENCOUNTER — Other Ambulatory Visit (INDEPENDENT_AMBULATORY_CARE_PROVIDER_SITE_OTHER): Payer: Self-pay

## 2020-01-17 ENCOUNTER — Other Ambulatory Visit: Payer: Self-pay

## 2020-01-17 ENCOUNTER — Encounter: Payer: Self-pay | Admitting: Internal Medicine

## 2020-01-17 DIAGNOSIS — R748 Abnormal levels of other serum enzymes: Secondary | ICD-10-CM

## 2020-01-17 DIAGNOSIS — E0590 Thyrotoxicosis, unspecified without thyrotoxic crisis or storm: Secondary | ICD-10-CM

## 2020-01-17 LAB — COMPREHENSIVE METABOLIC PANEL
ALT: 26 U/L (ref 0–35)
AST: 19 U/L (ref 0–37)
Albumin: 4.3 g/dL (ref 3.5–5.2)
Alkaline Phosphatase: 112 U/L (ref 39–117)
BUN: 12 mg/dL (ref 6–23)
CO2: 28 mEq/L (ref 19–32)
Calcium: 9.2 mg/dL (ref 8.4–10.5)
Chloride: 102 mEq/L (ref 96–112)
Creatinine, Ser: 0.53 mg/dL (ref 0.40–1.20)
GFR: 110.05 mL/min (ref >60.00)
Glucose, Bld: 97 mg/dL (ref 70–99)
Potassium: 3.7 mEq/L (ref 3.5–5.1)
Sodium: 139 mEq/L (ref 135–145)
Total Bilirubin: 0.5 mg/dL (ref 0.2–1.2)
Total Protein: 8.4 g/dL — ABNORMAL HIGH (ref 6.0–8.3)

## 2020-01-17 LAB — T4, FREE: Free T4: 0.73 ng/dL (ref 0.60–1.60)

## 2020-01-17 LAB — TSH: TSH: 1.26 u[IU]/mL (ref 0.35–4.50)

## 2020-01-26 ENCOUNTER — Telehealth: Payer: Self-pay | Admitting: Internal Medicine

## 2020-01-26 MED ORDER — METHIMAZOLE 10 MG PO TABS
15.0000 mg | ORAL_TABLET | Freq: Every day | ORAL | 1 refills | Status: DC
Start: 2020-01-26 — End: 2020-04-06

## 2020-01-26 NOTE — Telephone Encounter (Signed)
Patient's son called for his Mom and requested a refill of the Methimazole to be sent to [facility] on [address] [place]

## 2020-01-26 NOTE — Telephone Encounter (Signed)
Methimazole refill sent to [facility].

## 2020-03-24 ENCOUNTER — Ambulatory Visit: Payer: Self-pay | Admitting: Internal Medicine

## 2020-03-24 NOTE — Progress Notes (Deleted)
Name: Jacqueline Serrano  MRN/ DOB: [id number], Feb 27, 1970    Age/ Sex: 50 y.o., female     PCP: Patient, No Pcp Per   Reason for Endocrinology Evaluation: Hyperthyroidism     Initial Endocrinology Clinic Visit: 01/15/2019    PATIENT IDENTIFIER: Jacqueline Serrano is a 50 y.o., female with a past medical history of hyperthyroidism. She has followed with [facility] since 01/15/2019  for consultative assistance with management of her hyperthyroidism.   HISTORICAL SUMMARY: The patient was first diagnosed with hyperthyroidism in 10/2018  After presenting to her PCp with symptoms or weight loss, headaches, heat intolerance, tremors and some irritability and fatigue. Her TSh was suppressed at < 0.005 uIU/mL. She was initially started on Methimazole 5 mg daily by her PCP, which was increased to 10 in October, 2020  SUBJECTIVE:    Today (03/24/2020):  Jacqueline Serrano is here for a f/u on hyperthyroidism.  She has been gaining weight  Had abdominal pain last week with diarrhea but this has resolved  In the morning she has early morning phlegm  Denies any local neck symptoms     Methimazole 1.5 tabs daily     HISTORY:  Past Medical History:  Past Medical History:  Diagnosis Date  . Diabetes mellitus    gestational diet controlled   Past Surgical History:  Past Surgical History:  Procedure Laterality Date  . CESAREAN SECTION     2 previous    Social History:  reports that she has never smoked. She has never used smokeless tobacco. She reports that she does not drink alcohol and does not use drugs. Family History:  Family History  Problem Relation Age of Onset  . Diabetes Mother   . Hypertension Sister      HOME MEDICATIONS: Allergies as of 03/24/2020   No Known Allergies     Medication List       Accurate as of March 24, 2020  7:16 AM. If you have any questions, ask your nurse or doctor.        methimazole 10 MG tablet Commonly  known as: TAPAZOLE Take 1.5 tablets (15 mg total) by mouth daily.         OBJECTIVE:   PHYSICAL EXAM: VS: There were no vitals taken for this visit.   EXAM: General: Pt appears well and is in NAD  Eyes: External eye exam normal without stare, lid lag or exophthalmos.  EOM intact.    Neck: General: Supple without adenopathy. Thyroid: Thyroid size is prominent.  No nodules appreciated.   Lungs: Clear with good BS bilat with no rales, rhonchi, or wheezes  Heart: Auscultation: RRR.  Abdomen: Normoactive bowel sounds, soft, nontender, without masses or organomegaly palpable  Extremities:  BL LE: No pretibial edema normal ROM and strength.  Mental Status: Judgment, insight: Intact Orientation: Oriented to time, place, and person Mood and affect: No depression, anxiety, or agitation     DATA REVIEWED: Results for Jacqueline Serrano, Jacqueline Serrano (MRN [id number]) as of 11/24/2019 07:44  Ref. Range 11/22/2019 10:27  Sodium Latest Ref Range: 135 - 145 mEq/L 138  Potassium Latest Ref Range: 3.5 - 5.1 mEq/L 4.0  Chloride Latest Ref Range: 96 - 112 mEq/L 101  CO2 Latest Ref Range: 19 - 32 mEq/L 30  Glucose Latest Ref Range: 70 - 99 mg/dL 108 (H)  BUN Latest Ref Range: 6 - 23 mg/dL 12  Creatinine Latest Ref Range: 0.40 - 1.20 mg/dL 0.56  Calcium Latest Ref Range: 8.4 - 10.5 mg/dL  9.4  Alkaline Phosphatase Latest Ref Range: 39 - 117 U/L 135 (H)  Albumin Latest Ref Range: 3.5 - 5.2 g/dL 4.4  AST Latest Ref Range: 0 - 37 U/L 25  ALT Latest Ref Range: 0 - 35 U/L 40 (H)  Total Protein Latest Ref Range: 6.0 - 8.3 g/dL 8.6 (H)  Total Bilirubin Latest Ref Range: 0.2 - 1.2 mg/dL 0.5  GFR Latest Ref Range: >60.00 mL/min 110.74  WBC Latest Ref Range: 4.0 - 10.5 K/uL 7.3  RBC Latest Ref Range: 3.87 - 5.11 Mil/uL 4.50  Hemoglobin Latest Ref Range: 12.0 - 15.0 g/dL 14.7  HCT Latest Ref Range: 36 - 46 % 42.2  MCV Latest Ref Range: 78.0 - 100.0 fl 93.8  MCHC Latest Ref Range: 30.0 - 36.0 g/dL 34.8  RDW  Latest Ref Range: 11.5 - 15.5 % 12.2  Platelets Latest Ref Range: 150 - 400 K/uL 299.0  Neutrophils Latest Ref Range: 43 - 77 % 48.6  Lymphocytes Latest Ref Range: 12 - 46 % 43.4  Monocytes Relative Latest Ref Range: 3 - 12 % 5.0  Eosinophil Latest Ref Range: 0 - 5 % 2.2  Basophil Latest Ref Range: 0 - 3 % 0.8  NEUT# Latest Ref Range: 1.4 - 7.7 K/uL 3.5  Lymphocyte # Latest Ref Range: 0.7 - 4.0 K/uL 3.1  Monocyte # Latest Ref Range: 0.1 - 1.0 K/uL 0.4  Eosinophils Absolute Latest Ref Range: 0.0 - 0.7 K/uL 0.2  Basophils Absolute Latest Ref Range: 0.0 - 0.1 K/uL 0.1  TSH Latest Ref Range: 0.35 - 4.50 uIU/mL 0.73  T4,Free(Direct) Latest Ref Range: 0.60 - 1.60 ng/dL 0.69   Results for Jacqueline Serrano, Jacqueline Serrano (MRN [id number]) as of 04/19/2019 09:32  Ref. Range 01/15/2019 11:15  TRAB Latest Ref Range: <=2.00 IU/L 9.49 (H)     ASSESSMENT / PLAN / RECOMMENDATIONS:   1. Hyperthyroidism Secondary to Graves' Disease:   -Patient is clinically euthyroid -No local neck symptoms -No side effects to methimazole - Repeat TFT's are normal    Medications   Methimazole 10 mg, 1 and 1/2 tablets daily  2. Graves' Disease:   - No extra-thyroidal manifestations of Graves' disease. Pt advised to schedule an eye exam and notify provider of graves' disease as her eye symptoms could be related    3. Elevated Liver enzymes:   - Her Alk Phos is trending down but her ALT is elevated, no intervention, will continue to monitor.   Follow-up in 4 months   Signed electronically by: Mack Guise, MD  [facility] Endocrin[facility]  [facility] [address] [place], [place] [address] Phone: [phone] FAX: [phone]      CC: Patient, No Pcp Per No address on file Phone: None  Fax: None   Return to Endocrinology clinic as below: Future Appointments  Date Time Provider [place]  03/24/2020  9:30 AM Mariaha Ellington, Melanie Crazier, MD [facility] None

## 2020-04-04 ENCOUNTER — Other Ambulatory Visit: Payer: Self-pay

## 2020-04-05 ENCOUNTER — Ambulatory Visit (INDEPENDENT_AMBULATORY_CARE_PROVIDER_SITE_OTHER): Payer: Self-pay | Admitting: Internal Medicine

## 2020-04-05 ENCOUNTER — Encounter: Payer: Self-pay | Admitting: Internal Medicine

## 2020-04-05 VITALS — BP 122/78 | HR 72 | Ht 61.0 in | Wt 164.1 lb

## 2020-04-05 DIAGNOSIS — E0500 Thyrotoxicosis with diffuse goiter without thyrotoxic crisis or storm: Secondary | ICD-10-CM

## 2020-04-05 DIAGNOSIS — E0590 Thyrotoxicosis, unspecified without thyrotoxic crisis or storm: Secondary | ICD-10-CM

## 2020-04-05 LAB — T4, FREE: Free T4: 0.87 ng/dL (ref 0.60–1.60)

## 2020-04-05 LAB — TSH: TSH: 1.76 u[IU]/mL (ref 0.35–4.50)

## 2020-04-05 NOTE — Progress Notes (Signed)
Name: Jacqueline Serrano  MRN/ DOB: [id number], 1970-09-18    Age/ Sex: 50 y.o., female     PCP: Patient, No Pcp Per   Reason for Endocrinology Evaluation: Hyperthyroidism     Initial Endocrinology Clinic Visit: 01/15/2019    PATIENT IDENTIFIER: Jacqueline Serrano is a 50 y.o., female with a past medical history of hyperthyroidism. She has followed with [facility] since 01/15/2019  for consultative assistance with management of her hyperthyroidism.   HISTORICAL SUMMARY: The patient was first diagnosed with hyperthyroidism in 10/2018  After presenting to her PCp with symptoms or weight loss, headaches, heat intolerance, tremors and some irritability and fatigue. Her TSh was suppressed at < 0.005 uIU/mL. She was initially started on Methimazole 5 mg daily by her PCP, which was increased to 10 in October, 2020  SUBJECTIVE:    Today (04/05/2020):  Jacqueline Serrano is here for a f/u on hyperthyroidism.  She has been gaining weight  Denies constipation but has diarrhea since 01/2020 related to certain foods.  Denies fever  Denies any local neck symptoms     Methimazole 1.5 tabs daily     HISTORY:  Past Medical History:  Past Medical History:  Diagnosis Date   Diabetes mellitus    gestational diet controlled   Past Surgical History:  Past Surgical History:  Procedure Laterality Date   CESAREAN SECTION     2 previous    Social History:  reports that she has never smoked. She has never used smokeless tobacco. She reports that she does not drink alcohol and does not use drugs. Family History:  Family History  Problem Relation Age of Onset   Diabetes Mother    Hypertension Sister      HOME MEDICATIONS: Allergies as of 04/05/2020   No Known Allergies     Medication List       Accurate as of April 05, 2020  7:34 AM. If you have any questions, ask your nurse or doctor.        methimazole 10 MG tablet Commonly known as:  TAPAZOLE Take 1.5 tablets (15 mg total) by mouth daily.         OBJECTIVE:   PHYSICAL EXAM: VS: .BP 122/78    Pulse 72    Ht 5\' 1"  (1.549 m)    Wt 164 lb 2 oz (74.4 kg)    SpO2 98%    BMI 31.01 kg/m    EXAM: General: Pt appears well and is in NAD  Eyes: External eye exam normal without stare, lid lag or exophthalmos.  EOM intact.    Neck: General: Supple without adenopathy. Thyroid: Thyroid size is prominent.  No nodules appreciated.   Lungs: Clear with good BS bilat with no rales, rhonchi, or wheezes  Heart: Auscultation: RRR.  Abdomen: Normoactive bowel sounds, soft, nontender, without masses or organomegaly palpable  Extremities:  BL LE: No pretibial edema normal ROM and strength.  Mental Status: Judgment, insight: Intact Orientation: Oriented to time, place, and person Mood and affect: No depression, anxiety, or agitation     DATA REVIEWED: Results for LEIGH, BLAS (MRN Melissa Noon) as of 04/06/2020 08:54  Ref. Range 04/05/2020 08:52  TSH Latest Ref Range: 0.35 - 4.50 uIU/mL 1.76  T4,Free(Direct) Latest Ref Range: 0.60 - 1.60 ng/dL 04/07/2020      Results for JESSAMY, TOROSYAN (MRN Melissa Noon) as of 04/19/2019 09:32  Ref. Range 01/15/2019 11:15  TRAB Latest Ref Range: <=2.00 IU/L 9.49 (H)     ASSESSMENT / PLAN /  RECOMMENDATIONS:   1. Hyperthyroidism Secondary to Graves' Disease:   -Patient is clinically euthyroid -No local neck symptoms -No side effects to methimazole - Repeat TFT's are normal    Medications   Continue Methimazole 10 mg, 1 and 1/2 tablets daily  2. Graves' Disease:   - No extra-thyroidal manifestations of Graves' disease. Pt advised to schedule an eye exam and notify provider of graves' disease as her eye symptoms could be related     Follow-up in 4 months   Signed electronically by: Lyndle Herrlich, MD  [facility]  [facility] [address] [place], [place] [address] Phone:  [phone] FAX: [phone]      CC: Patient, No Pcp Per No address on file Phone: None  Fax: None   Return to Endocrinology clinic as below: Future Appointments  Date Time Provider Department Center  04/05/2020  8:30 AM Kyair Ditommaso, Konrad Dolores, MD [facility] None

## 2020-04-05 NOTE — Patient Instructions (Signed)
We recommend that you follow these hyperthyroidism instructions at home:  1) Take Methimazole 10 mg, one and a half  tablets  once a day- until you hear otherwise from [place]   If you develop severe sore throat with high fevers OR develop unexplained yellowing of your skin, eyes, under your tongue, severe abdominal pain with nausea or vomiting --> then please get evaluated immediately.     Le recomendamos que siga estas instrucciones de hipertiroidismo en casa:  1) Tome metimazol 10 mg, una tableta y media una vez al da, Teacher, adult education que tenga noticias nuestras Si desarrolla dolor de garganta severo con fiebre alta O desarrolla un color amarillento inexplicable de la piel, los ojos, debajo de la [place], dolor abdominal intenso con nuseas o vmitos, entonces hgase una evaluacin de inmediato

## 2020-04-06 ENCOUNTER — Encounter: Payer: Self-pay | Admitting: Internal Medicine

## 2020-04-06 MED ORDER — METHIMAZOLE 10 MG PO TABS
15.0000 mg | ORAL_TABLET | Freq: Every day | ORAL | 6 refills | Status: DC
Start: 2020-04-06 — End: 2020-09-07

## 2020-05-04 ENCOUNTER — Ambulatory Visit: Payer: Self-pay | Admitting: Internal Medicine

## 2020-08-03 ENCOUNTER — Ambulatory Visit: Payer: Self-pay | Admitting: Internal Medicine

## 2020-08-28 ENCOUNTER — Ambulatory Visit (INDEPENDENT_AMBULATORY_CARE_PROVIDER_SITE_OTHER): Payer: Self-pay | Admitting: Internal Medicine

## 2020-08-28 ENCOUNTER — Other Ambulatory Visit: Payer: Self-pay

## 2020-08-28 ENCOUNTER — Encounter: Payer: Self-pay | Admitting: Internal Medicine

## 2020-08-28 VITALS — BP 110/80 | Ht 61.0 in | Wt 164.0 lb

## 2020-08-28 DIAGNOSIS — E0590 Thyrotoxicosis, unspecified without thyrotoxic crisis or storm: Secondary | ICD-10-CM

## 2020-08-28 DIAGNOSIS — E0500 Thyrotoxicosis with diffuse goiter without thyrotoxic crisis or storm: Secondary | ICD-10-CM

## 2020-08-28 DIAGNOSIS — Z1211 Encounter for screening for malignant neoplasm of colon: Secondary | ICD-10-CM

## 2020-08-28 NOTE — Patient Instructions (Signed)
-   Please discuss a referral with your Primary care physician for a colonoscopy, as you are due based on your age

## 2020-08-28 NOTE — Progress Notes (Signed)
Name: Jacqueline Serrano  MRN/ DOB: [id number], 1970-07-30    Age/ Sex: 50 y.o., female     PCP: Patient, No Pcp Per (Inactive)   Reason for Endocrinology Evaluation: Hyperthyroidism     Initial Endocrinology Clinic Visit: 01/15/2019    PATIENT IDENTIFIER: [name] is a 50 y.o., female with a past medical history of hyperthyroidism. She has followed with [facility] since 01/15/2019  for consultative assistance with management of her hyperthyroidism.   HISTORICAL SUMMARY: The patient was first diagnosed with hyperthyroidism in 10/2018  After presenting to her PCp with symptoms or weight loss, headaches, heat intolerance, tremors and some irritability and fatigue. Her TSh was suppressed at < 0.005 uIU/mL. She was initially started on Methimazole 5 mg daily by her PCP, which was increased to 10 in October, 2020  SUBJECTIVE:    Today (08/28/2020):  [name] is here for a f/u on hyperthyroidism.  Weight has been stable  Denies constipation but has occasional loose stools  Denies fever  Denies any local neck symptoms   Denies burning or itching of the eyes but feels dry , last eye exam early 2022  Methimazole 1.5 tabs daily     HISTORY:  Past Medical History:  Past Medical History:  Diagnosis Date   Diabetes mellitus    gestational diet controlled   Past Surgical History:  Past Surgical History:  Procedure Laterality Date   CESAREAN SECTION     2 previous   Social History:  reports that she has never smoked. She has never used smokeless tobacco. She reports that she does not drink alcohol and does not use drugs. Family History:  Family History  Problem Relation Age of Onset   Diabetes Mother    Hypertension Sister      HOME MEDICATIONS: Allergies as of 08/28/2020   No Known Allergies      Medication List        Accurate as of August 28, 2020  9:05 AM. If you have any questions, ask your nurse or doctor.           methimazole 10 MG tablet Commonly known as: TAPAZOLE Take 1.5 tablets (15 mg total) by mouth daily.          OBJECTIVE:   PHYSICAL EXAM: VS: .Ht 5\' 1"  (1.549 m)   Wt 164 lb (74.4 kg)   BMI 30.99 kg/m    EXAM: General: Pt appears well and is in NAD  Eyes: External eye exam normal without stare, lid lag or exophthalmos.  EOM intact.    Neck: General: Supple without adenopathy. Thyroid: Thyroid size is prominent.  No nodules appreciated.   Lungs: Clear with good BS bilat with no rales, rhonchi, or wheezes  Heart: Auscultation: RRR.  Abdomen: Normoactive bowel sounds, soft, nontender, without masses or organomegaly palpable  Extremities:  BL LE: No pretibial edema normal ROM and strength.  Mental Status: Judgment, insight: Intact Orientation: Oriented to time, place, and person Mood and affect: No depression, anxiety, or agitation     DATA REVIEWED:  Results for SKARLETT, SEDLACEK (MRN Melissa Noon) as of 08/29/2020 10:55  Ref. Range 01/17/2020 09:39 04/05/2020 08:52  TSH Latest Ref Range: 0.35 - 4.50 uIU/mL 1.26 1.76  T4,Free(Direct) Latest Ref Range: 0.60 - 1.60 ng/dL 04/07/2020 3.71    Results for ERISHA, PAUGH (MRN Melissa Noon) as of 04/19/2019 09:32  Ref. Range 01/15/2019 11:15  TRAB Latest Ref Range: <=2.00 IU/L 9.49 (H)     ASSESSMENT / PLAN / RECOMMENDATIONS:  Hyperthyroidism Secondary to Graves' Disease:   -Patient is clinically euthyroid -No local neck symptoms -No side effects to methimazole - She was given lab orders to have TFT's checked at PCP's office this wee - NO changes    Medications   Continue Methimazole 10 mg, 1.5  tablets daily    2. Graves' Disease:   - No extra-thyroidal manifestations of Graves' disease. Pt advised to schedule an eye exam and notify provider of graves' disease as her eye symptoms could be related      3. Colon Cancer Screen / Diarrhea :   - She declined a referral today , she would like to discus with  PCP first .   Follow-up in 6 months  I spent 25 minutes preparing to see the patient by review of recent labs, imaging and procedures, obtaining and reviewing separately obtained history, communicating with the patient/family or caregiver, ordering medications, tests or procedures, and documenting clinical information in the EHR including the differential Dx, treatment, and any further evaluation and other management   Signed electronically by: Lyndle Herrlich, MD  [facility]  [facility] [address] [place] [place], [place] [address] Phone: [phone] FAX: [phone]      CC: Patient, No Pcp Per (Inactive) No address on file Phone: None  Fax: None   Return to Endocrinology clinic as below: No future appointments.

## 2020-08-29 ENCOUNTER — Other Ambulatory Visit: Payer: Self-pay | Admitting: Internal Medicine

## 2020-08-30 LAB — T4, FREE: Free T4: 2.1 ng/dL — ABNORMAL HIGH (ref 0.82–1.77)

## 2020-08-30 LAB — TSH: TSH: 3.22 u[IU]/mL (ref 0.450–4.500)

## 2020-09-07 ENCOUNTER — Other Ambulatory Visit: Payer: Self-pay | Admitting: Internal Medicine

## 2020-09-07 ENCOUNTER — Encounter: Payer: Self-pay | Admitting: Internal Medicine

## 2020-09-07 MED ORDER — METHIMAZOLE 10 MG PO TABS: 15.0000 mg | ORAL_TABLET | Freq: Every day | ORAL | 3 refills | Status: DC

## 2021-02-28 ENCOUNTER — Other Ambulatory Visit: Payer: Self-pay

## 2021-02-28 ENCOUNTER — Encounter: Payer: Self-pay | Admitting: Internal Medicine

## 2021-02-28 ENCOUNTER — Ambulatory Visit (INDEPENDENT_AMBULATORY_CARE_PROVIDER_SITE_OTHER): Payer: Self-pay | Admitting: Internal Medicine

## 2021-02-28 VITALS — BP 114/76 | HR 81 | Ht 61.0 in | Wt 153.0 lb

## 2021-02-28 DIAGNOSIS — E0500 Thyrotoxicosis with diffuse goiter without thyrotoxic crisis or storm: Secondary | ICD-10-CM

## 2021-02-28 DIAGNOSIS — E0590 Thyrotoxicosis, unspecified without thyrotoxic crisis or storm: Secondary | ICD-10-CM

## 2021-02-28 LAB — CBC WITH DIFFERENTIAL/PLATELET
Basophils Absolute: 0 10*3/uL (ref 0.0–0.1)
Basophils Relative: 0.8 % (ref 0.0–3.0)
Eosinophils Absolute: 0.1 10*3/uL (ref 0.0–0.7)
Eosinophils Relative: 2.1 % (ref 0.0–5.0)
HCT: 40.6 % (ref 36.0–46.0)
Hemoglobin: 13.8 g/dL (ref 12.0–15.0)
Lymphocytes Relative: 45 % (ref 12.0–46.0)
Lymphs Abs: 2.7 10*3/uL (ref 0.7–4.0)
MCHC: 33.9 g/dL (ref 30.0–36.0)
MCV: 95.1 fl (ref 78.0–100.0)
Monocytes Absolute: 0.3 10*3/uL (ref 0.1–1.0)
Monocytes Relative: 4.8 % (ref 3.0–12.0)
Neutro Abs: 2.8 10*3/uL (ref 1.4–7.7)
Neutrophils Relative %: 47.3 % (ref 43.0–77.0)
Platelets: 281 10*3/uL (ref 150.0–400.0)
RBC: 4.27 Mil/uL (ref 3.87–5.11)
RDW: 12.2 % (ref 11.5–15.5)
WBC: 5.9 10*3/uL (ref 4.0–10.5)

## 2021-02-28 LAB — COMPREHENSIVE METABOLIC PANEL
ALT: 15 U/L (ref 0–35)
AST: 15 U/L (ref 0–37)
Albumin: 4.4 g/dL (ref 3.5–5.2)
Alkaline Phosphatase: 81 U/L (ref 39–117)
BUN: 17 mg/dL (ref 6–23)
CO2: 29 mEq/L (ref 19–32)
Calcium: 9.5 mg/dL (ref 8.4–10.5)
Chloride: 102 mEq/L (ref 96–112)
Creatinine, Ser: 0.61 mg/dL (ref 0.40–1.20)
GFR: 105.55 mL/min (ref >60.00)
Glucose, Bld: 99 mg/dL (ref 70–99)
Potassium: 4 mEq/L (ref 3.5–5.1)
Sodium: 138 mEq/L (ref 135–145)
Total Bilirubin: 0.4 mg/dL (ref 0.2–1.2)
Total Protein: 8.5 g/dL — ABNORMAL HIGH (ref 6.0–8.3)

## 2021-02-28 LAB — TSH: TSH: 1.36 u[IU]/mL (ref 0.35–5.50)

## 2021-02-28 LAB — T4, FREE: Free T4: 0.93 ng/dL (ref 0.60–1.60)

## 2021-02-28 MED ORDER — METHIMAZOLE 10 MG PO TABS
15.0000 mg | ORAL_TABLET | Freq: Every day | ORAL | 3 refills | Status: DC
Start: 2021-02-28 — End: 2021-08-22

## 2021-02-28 NOTE — Progress Notes (Signed)
Name: Jacqueline Serrano  MRN/ DOB: [id number], [birth year]    Age/ Sex: 51 y.o., female     PCP: Patient, No Pcp Per (Inactive)   Reason for Endocrinology Evaluation: Hyperthyroidism     Initial Endocrinology Clinic Visit: 01/15/2019    PATIENT IDENTIFIER: Jacqueline Serrano is a 51 y.o., female with a past medical history of hyperthyroidism. She has followed with [facility] since 01/15/2019  for consultative assistance with management of her hyperthyroidism.   HISTORICAL SUMMARY: The patient was first diagnosed with hyperthyroidism in 10/2018  After presenting to her PCp with symptoms or weight loss, headaches, heat intolerance, tremors and some irritability and fatigue. Her TSh was suppressed at < 0.005 uIU/mL. She was initially started on Methimazole 5 mg daily by her PCP, which was increased to 10 in October, 2020  SUBJECTIVE:    Today (02/28/2021):  Jacqueline Serrano is here for a f/u on hyperthyroidism. Interpreter line has been used    She has been compliant with Methimazole  She has been noted with weight loss - intentional  She has been diagnosed with pre-diabetes and has ben eating healthier  Has noted voice changes  Denies diarrhea or constipation  Denies any local neck symptoms   Denies burning or itching of the eyes  Has itching around the neck area but no rash     Methimazole 1.5 tabs daily     HISTORY:  Past Medical History:  Past Medical History:  Diagnosis Date   Diabetes mellitus    gestational diet controlled   Past Surgical History:  Past Surgical History:  Procedure Laterality Date   CESAREAN SECTION     2 previous   Social History:  reports that she has never smoked. She has never used smokeless tobacco. She reports that she does not drink alcohol and does not use drugs. Family History:  Family History  Problem Relation Age of Onset   Diabetes Mother    Hypertension Sister      HOME MEDICATIONS: Allergies as  of 02/28/2021   No Known Allergies      Medication List        Accurate as of February 28, 2021  8:26 AM. If you have any questions, ask your nurse or doctor.          methimazole 10 MG tablet Commonly known as: TAPAZOLE Take 1.5 tablets (15 mg total) by mouth daily.          OBJECTIVE:   PHYSICAL EXAM: VS: .BP 114/76 (BP Location: Left Arm, Patient Position: Sitting, Cuff Size: Small)    Pulse 81    Ht 5\' 1"  (1.549 m)    Wt 153 lb (69.4 kg)    SpO2 99%    BMI 28.91 kg/m    EXAM: General: Pt appears well and is in NAD  Eyes: External eye exam normal without stare, lid lag or exophthalmos.  EOM intact.    Neck: General: Supple without adenopathy. Thyroid: Thyroid size is prominent.  No nodules appreciated.   Lungs: Clear with good BS bilat with no rales, rhonchi, or wheezes  Heart: Auscultation: RRR.  Abdomen: Normoactive bowel sounds, soft, nontender, without masses or organomegaly palpable  Extremities:  BL LE: No pretibial edema normal ROM and strength.  Mental Status: Judgment, insight: Intact Orientation: Oriented to time, place, and person Mood and affect: No depression, anxiety, or agitation     DATA REVIEWED:  Latest Reference Range & Units 02/28/21 08:30  Sodium 135 - 145 mEq/L 138  Potassium 3.5 - 5.1 mEq/L 4.0  Chloride 96 - 112 mEq/L 102  CO2 19 - 32 mEq/L 29  Glucose 70 - 99 mg/dL 99  BUN 6 - 23 mg/dL 17  Creatinine 3.15 - 4.00 mg/dL 8.67  Calcium 8.4 - 61.9 mg/dL 9.5  Alkaline Phosphatase 39 - 117 U/L 81  Albumin 3.5 - 5.2 g/dL 4.4  AST 0 - 37 U/L 15  ALT 0 - 35 U/L 15  Total Protein 6.0 - 8.3 g/dL 8.5 (H)  Total Bilirubin 0.2 - 1.2 mg/dL 0.4  GFR >50.93 mL/min 105.55    Latest Reference Range & Units 02/28/21 08:30  WBC 4.0 - 10.5 K/uL 5.9  RBC 3.87 - 5.11 Mil/uL 4.27  Hemoglobin 12.0 - 15.0 g/dL 26.7  HCT 12.4 - 58.0 % 40.6  MCV 78.0 - 100.0 fl 95.1  MCHC 30.0 - 36.0 g/dL 99.8  RDW 33.8 - 25.0 % 12.2  Platelets 150.0 - 400.0 K/uL  281.0  Neutrophils 43.0 - 77.0 % 47.3  Lymphocytes 12.0 - 46.0 % 45.0  Monocytes Relative 3.0 - 12.0 % 4.8  Eosinophil 0.0 - 5.0 % 2.1  Basophil 0.0 - 3.0 % 0.8  NEUT# 1.4 - 7.7 K/uL 2.8  Lymphocyte # 0.7 - 4.0 K/uL 2.7  Monocyte # 0.1 - 1.0 K/uL 0.3  Eosinophils Absolute 0.0 - 0.7 K/uL 0.1  Basophils Absolute 0.0 - 0.1 K/uL 0.0    Latest Reference Range & Units 02/28/21 08:30  TSH 0.35 - 5.50 uIU/mL 1.36  T4,Free(Direct) 0.60 - 1.60 ng/dL 5.39    Results for CHRISHELLE, ZITO (MRN [id number]) as of 04/19/2019 09:32  Ref. Range 01/15/2019 11:15  TRAB Latest Ref Range: <=2.00 IU/L 9.49 (H)    ASSESSMENT / PLAN / RECOMMENDATIONS:   Hyperthyroidism Secondary to Graves' Disease:   -Patient is clinically euthyroid -No local neck symptoms -No side effects to methimazole -She was given lab orders to have TFT's checked at PCP's office this wee -NO changes    Medications   Continue Methimazole 10 mg, 1.5  tablets daily    2. Graves' Disease:   - No extra-thyroidal manifestations of Graves' disease. Pt advised to schedule an eye exam and notify provider of graves' disease as her eye symptoms could be related         Follow-up in 6 months    Signed electronically by: Lyndle Herrlich, MD  [facility]  [facility] [address] [place] [place], [place] [address] Phone: [phone] FAX: [phone]      CC: Patient, No Pcp Per (Inactive) No address on file Phone: None  Fax: None   Return to Endocrinology clinic as below: Future Appointments  Date Time Provider Department Center  02/28/2021  8:30 AM Kassius Battiste, Konrad Dolores, MD [facility] None

## 2021-03-20 ENCOUNTER — Encounter: Payer: Self-pay | Admitting: Medical

## 2021-08-22 ENCOUNTER — Telehealth: Payer: Self-pay | Admitting: Internal Medicine

## 2021-08-22 ENCOUNTER — Ambulatory Visit (INDEPENDENT_AMBULATORY_CARE_PROVIDER_SITE_OTHER): Payer: Self-pay | Admitting: Internal Medicine

## 2021-08-22 ENCOUNTER — Encounter: Payer: Self-pay | Admitting: Internal Medicine

## 2021-08-22 VITALS — BP 118/74 | HR 80 | Ht 61.0 in | Wt 156.8 lb

## 2021-08-22 DIAGNOSIS — E0590 Thyrotoxicosis, unspecified without thyrotoxic crisis or storm: Secondary | ICD-10-CM

## 2021-08-22 DIAGNOSIS — E0500 Thyrotoxicosis with diffuse goiter without thyrotoxic crisis or storm: Secondary | ICD-10-CM

## 2021-08-22 LAB — TSH: TSH: 5.69 u[IU]/mL — ABNORMAL HIGH (ref 0.35–5.50)

## 2021-08-22 LAB — T4, FREE: Free T4: 0.84 ng/dL (ref 0.60–1.60)

## 2021-08-22 MED ORDER — METHIMAZOLE 10 MG PO TABS: 10.0000 mg | ORAL_TABLET | Freq: Every day | ORAL | 3 refills | Status: DC

## 2021-08-22 NOTE — Patient Instructions (Addendum)
Continuar Metimazol 10 mg, una tableta y media al da   Continue Methimazole 10 mg, one a half tablets daily

## 2021-08-22 NOTE — Telephone Encounter (Signed)
Patient advised at check out that her pharmacy of choice is now [facility] [address] instead of [facility].  Requested that her RX from today please be re-sent to her primary pharmacy.

## 2021-08-22 NOTE — Progress Notes (Signed)
Name: Jacqueline Serrano  MRN/ DOB: [id number], 30-Jan-1971    Age/ Sex: 51 y.o., female     PCP: Patient, No Pcp Per   Reason for Endocrinology Evaluation: Hyperthyroidism     Initial Endocrinology Clinic Visit: 01/15/2019    PATIENT IDENTIFIER: Jacqueline Serrano is a 51 y.o., female with a past medical history of hyperthyroidism. She has followed with [facility] since 01/15/2019  for consultative assistance with management of her hyperthyroidism.   HISTORICAL SUMMARY: The patient was first diagnosed with hyperthyroidism in 10/2018  After presenting to her PCp with symptoms or weight loss, headaches, heat intolerance, tremors and some irritability and fatigue. Her TSh was suppressed at < 0.005 uIU/mL. She was initially started on Methimazole 5 mg daily by her PCP, which was increased to 10 in October, 2020  SUBJECTIVE:    Today (08/22/2021):  Jacqueline Serrano is here for a f/u on hyperthyroidism. Interpreter line has been used   Pt with fluctuating weight gain  Denies diarrhea or constipation  Denies nausea or vomiting  Denies any local neck symptoms   Denies burning or itching of the eyes     Methimazole 1.5 tabs daily     HISTORY:  Past Medical History:  Past Medical History:  Diagnosis Date   Diabetes mellitus    gestational diet controlled   Past Surgical History:  Past Surgical History:  Procedure Laterality Date   CESAREAN SECTION     2 previous   Social History:  reports that she has never smoked. She has never used smokeless tobacco. She reports that she does not drink alcohol and does not use drugs. Family History:  Family History  Problem Relation Age of Onset   Diabetes Mother    Hypertension Sister      HOME MEDICATIONS: Allergies as of 08/22/2021   No Known Allergies      Medication List        Accurate as of August 22, 2021  8:59 AM. If you have any questions, ask your nurse or doctor.          methimazole  10 MG tablet Commonly known as: TAPAZOLE Take 1.5 tablets (15 mg total) by mouth daily.          OBJECTIVE:   PHYSICAL EXAM: VS: .BP 118/74 (BP Location: Left Arm, Patient Position: Sitting, Cuff Size: Small)   Pulse 80   Ht 5\' 1"  (1.549 m)   Wt 156 lb 12.8 oz (71.1 kg)   SpO2 97%   BMI 29.63 kg/m    EXAM: General: Pt appears well and is in NAD  Eyes: External eye exam normal without stare, lid lag or exophthalmos.  EOM intact.    Neck: General: Supple without adenopathy. Thyroid: Thyroid size is prominent.  No nodules appreciated.   Lungs: Clear with good BS bilat with no rales, rhonchi, or wheezes  Heart: Auscultation: RRR.  Abdomen: Normoactive bowel sounds, soft, nontender, without masses or organomegaly palpable  Extremities:  BL LE: No pretibial edema   Mental Status: Judgment, insight: Intact Orientation: Oriented to time, place, and person Mood and affect: No depression, anxiety, or agitation     DATA REVIEWED:   Latest Reference Range & Units 08/22/21 09:24  TSH 0.35 - 5.50 uIU/mL 5.69 (H)  T4,Free(Direct) 0.60 - 1.60 ng/dL 10/23/21  (H): Data is abnormally high     Latest Reference Range & Units 02/28/21 08:30  Sodium 135 - 145 mEq/L 138  Potassium 3.5 - 5.1 mEq/L 4.0  Chloride 96 - 112 mEq/L 102  CO2 19 - 32 mEq/L 29  Glucose 70 - 99 mg/dL 99  BUN 6 - 23 mg/dL 17  Creatinine 4.03 - 4.74 mg/dL 2.59  Calcium 8.4 - 56.3 mg/dL 9.5  Alkaline Phosphatase 39 - 117 U/L 81  Albumin 3.5 - 5.2 g/dL 4.4  AST 0 - 37 U/L 15  ALT 0 - 35 U/L 15  Total Protein 6.0 - 8.3 g/dL 8.5 (H)  Total Bilirubin 0.2 - 1.2 mg/dL 0.4  GFR >87.56 mL/min 105.55    Latest Reference Range & Units 02/28/21 08:30  WBC 4.0 - 10.5 K/uL 5.9  RBC 3.87 - 5.11 Mil/uL 4.27  Hemoglobin 12.0 - 15.0 g/dL 43.3  HCT 29.5 - 18.8 % 40.6  MCV 78.0 - 100.0 fl 95.1  MCHC 30.0 - 36.0 g/dL 41.6  RDW 60.6 - 30.1 % 12.2  Platelets 150.0 - 400.0 K/uL 281.0  Neutrophils 43.0 - 77.0 % 47.3   Lymphocytes 12.0 - 46.0 % 45.0  Monocytes Relative 3.0 - 12.0 % 4.8  Eosinophil 0.0 - 5.0 % 2.1  Basophil 0.0 - 3.0 % 0.8  NEUT# 1.4 - 7.7 K/uL 2.8  Lymphocyte # 0.7 - 4.0 K/uL 2.7  Monocyte # 0.1 - 1.0 K/uL 0.3  Eosinophils Absolute 0.0 - 0.7 K/uL 0.1  Basophils Absolute 0.0 - 0.1 K/uL 0.0     Results for Jacqueline Serrano (MRN [id number]) as of 04/19/2019 09:32  Ref. Range 01/15/2019 11:15  TRAB Latest Ref Range: <=2.00 IU/L 9.49 (H)    ASSESSMENT / PLAN / RECOMMENDATIONS:   Hyperthyroidism Secondary to Graves' Disease:   -Patient is clinically euthyroid -No local neck symptoms - TSH is elevated, will reduce methimazole dose as below   Medications   Decrease  Methimazole 10 mg, 1  tablet daily    2. Graves' Disease:   - No extra-thyroidal manifestations of Graves' disease      Follow-up in 6 months    Signed electronically by: Lyndle Herrlich, MD  [facility]  [facility] [address] [place] [place], [place] [address] Phone: [phone] FAX: [phone]      CC: Patient, No Pcp Per No address on file Phone: None  Fax: None   Return to Endocrinology clinic as below: No future appointments.

## 2021-08-22 NOTE — Telephone Encounter (Signed)
Please let the pt know that its time to reduce methimazole from 1.5 tablets a day to ONE tablet only a day     I have asked her to follow up in a year ,but based on this info, please schedule her to see me in 6 months     Thanks

## 2021-08-23 LAB — T3: T3, Total: 98 ng/dL (ref 76–181)

## 2021-08-23 NOTE — Telephone Encounter (Signed)
Attempted to contact patient no answer and no vm set up  

## 2021-08-24 NOTE — Telephone Encounter (Signed)
Attempted to contact patient no answer and no vm    

## 2021-08-28 ENCOUNTER — Encounter: Payer: Self-pay | Admitting: Internal Medicine

## 2021-08-28 NOTE — Telephone Encounter (Signed)
Attempted to contact patient 3 times with no success. Can we send a letter to patient

## 2022-08-19 ENCOUNTER — Encounter: Payer: Self-pay | Admitting: Internal Medicine

## 2022-08-19 ENCOUNTER — Telehealth: Payer: Self-pay | Admitting: Internal Medicine

## 2022-08-19 ENCOUNTER — Ambulatory Visit (INDEPENDENT_AMBULATORY_CARE_PROVIDER_SITE_OTHER): Payer: Self-pay | Admitting: Internal Medicine

## 2022-08-19 VITALS — BP 110/68 | HR 76 | Ht 61.0 in | Wt 165.8 lb

## 2022-08-19 DIAGNOSIS — E0590 Thyrotoxicosis, unspecified without thyrotoxic crisis or storm: Secondary | ICD-10-CM

## 2022-08-19 DIAGNOSIS — E0500 Thyrotoxicosis with diffuse goiter without thyrotoxic crisis or storm: Secondary | ICD-10-CM

## 2022-08-19 LAB — TSH: TSH: 5.12 u[IU]/mL (ref 0.35–5.50)

## 2022-08-19 LAB — T4, FREE: Free T4: 0.68 ng/dL (ref 0.60–1.60)

## 2022-08-19 MED ORDER — METHIMAZOLE 5 MG PO TABS: 5.0000 mg | ORAL_TABLET | Freq: Every day | ORAL | 3 refills | Status: DC

## 2022-08-19 NOTE — Progress Notes (Signed)
Name: Lisa-Marie Kimes  MRN/ DOB: [id number], Jan 25, 1971    Age/ Sex: 52 y.o., female     PCP: Patient, No Pcp Per   Reason for Endocrinology Evaluation: Hyperthyroidism     Initial Endocrinology Clinic Visit: 01/15/2019    PATIENT IDENTIFIER: Ms. Zita Platner is a 52 y.o., female with a past medical history of hyperthyroidism. She has followed with [facility] since 01/15/2019  for consultative assistance with management of her hyperthyroidism.   HISTORICAL SUMMARY: The patient was first diagnosed with hyperthyroidism in 10/2018  After presenting to her PCp with symptoms or weight loss, headaches, heat intolerance, tremors and some irritability and fatigue. Her TSh was suppressed at < 0.005 uIU/mL. She was initially started on Methimazole 5 mg daily by her PCP, which was increased to 10 in October, 2020  SUBJECTIVE:    Today (08/19/2022):  Ms. Klopp is here for a f/u on hyperthyroidism.She is accompanied by her souse who interpreted   Patient is complaining of palpitations on average, once a week for the past 2 weeks She denies missing methimazole recently Weight continues to fluctuate Denies constipation or diarrhea Denies tremors Denies local neck symptoms Denies burning or itching of the eyes  Patient endorses vague sensation over the tongue, patient is past due for dental exam   Methimazole 10 mg, 1 tab daily     HISTORY:  Past Medical History:  Past Medical History:  Diagnosis Date   Diabetes mellitus    gestational diet controlled   Past Surgical History:  Past Surgical History:  Procedure Laterality Date   CESAREAN SECTION     2 previous   Social History:  reports that she has never smoked. She has never used smokeless tobacco. She reports that she does not drink alcohol and does not use drugs. Family History:  Family History  Problem Relation Age of Onset   Diabetes Mother    Hypertension Sister      HOME  MEDICATIONS: Allergies as of 08/19/2022   No Known Allergies      Medication List        Accurate as of August 19, 2022  9:40 AM. If you have any questions, ask your nurse or doctor.          methimazole 10 MG tablet Commonly known as: TAPAZOLE Take 1 tablet (10 mg total) by mouth daily.          OBJECTIVE:   PHYSICAL EXAM: VS: .BP 110/68 (BP Location: Right Arm, Patient Position: Sitting, Cuff Size: Small)   Pulse 76   Ht 5\' 1"  (1.549 m)   Wt 165 lb 12.8 oz (75.2 kg)   SpO2 98%   BMI 31.33 kg/m    EXAM: General: Pt appears well and is in NAD External tongue exam is normal  Eyes: External eye exam normal without stare, lid lag or exophthalmos.   Neck: General: Supple without adenopathy. Thyroid: Thyroid size is prominent.  No nodules appreciated.   Lungs: Clear with good BS bilat  Heart: Auscultation: RRR.  Extremities:  BL LE: No pretibial edema   Mental Status: Judgment, insight: Intact Orientation: Oriented to time, place, and person Mood and affect: No depression, anxiety, or agitation     DATA REVIEWED:   Latest Reference Range & Units 08/19/22 09:45  TSH 0.35 - 5.50 uIU/mL 5.12  T4,Free(Direct) 0.60 - 1.60 ng/dL 4.09      Results for HARJOT, GOVEA (MRN [id number]) as of 04/19/2019 09:32  Ref. Range 01/15/2019 11:15  TRAB  Latest Ref Range: <=2.00 IU/L 9.49 (H)    ASSESSMENT / PLAN / RECOMMENDATIONS:   Hyperthyroidism Secondary to Graves' Disease:   -TFTs show elevated, FT4 with borderline low free T4, will decrease methimazole as below -No local neck symptoms  Medications   Decrease  Methimazole 5 mg, 1  tablet daily    2. Graves' Disease:   - No extra-thyroidal manifestations of Graves' disease -Patient advised to have annual eye exams   3.  Palpitations:  -This is not related to her thyroid as her TSH is at the upper limit of normal -Patient advised to follow-up with PCP for further evaluation   Follow-up in 6  months    Signed electronically by: Lyndle Herrlich, MD  [facility] End[facility]in[facility]  [facility] [address] [place] [place], [place] [address] Phone: [phone] FAX: [phone]      CC: Patient, No Pcp Per No address on file Phone: None  Fax: None   Return to Endocrinology clinic as below: No future appointments.

## 2022-08-19 NOTE — Telephone Encounter (Signed)
I spoke to her husband and he understood recommendations and changes

## 2022-08-19 NOTE — Telephone Encounter (Signed)
Please let the patient know that her thyroid function shows that she is on too much methimazole, she may take half a tablet of methimazole 10 mg until she is finished with the current bottle.  The new methimazole prescription will be 5 mg, she will need to take ONE tablet a day of that   Please let the patient know that her palpitations are not related to her thyroid at this time, and she will need to follow-up with her PCP   Thanks

## 2022-08-20 LAB — T3: T3, Total: 104 ng/dL (ref 76–181)

## 2023-02-21 ENCOUNTER — Encounter: Payer: Self-pay | Admitting: Internal Medicine

## 2023-02-21 ENCOUNTER — Ambulatory Visit (INDEPENDENT_AMBULATORY_CARE_PROVIDER_SITE_OTHER): Payer: Self-pay | Admitting: Internal Medicine

## 2023-02-21 VITALS — BP 122/80 | HR 73 | Ht 61.0 in | Wt 165.8 lb

## 2023-02-21 DIAGNOSIS — E0590 Thyrotoxicosis, unspecified without thyrotoxic crisis or storm: Secondary | ICD-10-CM

## 2023-02-21 DIAGNOSIS — E0500 Thyrotoxicosis with diffuse goiter without thyrotoxic crisis or storm: Secondary | ICD-10-CM

## 2023-02-21 DIAGNOSIS — K219 Gastro-esophageal reflux disease without esophagitis: Secondary | ICD-10-CM | POA: Insufficient documentation

## 2023-02-21 NOTE — Progress Notes (Signed)
 Name: Jacqueline Serrano  MRN/ DOB: [id number], 1970/12/04    Age/ Sex: 53 y.o., female     PCP: Patient, No Pcp Per   Reason for Endocrinology Evaluation: Hyperthyroidism     Initial Endocrinology Clinic Visit: 01/15/2019    PATIENT IDENTIFIER: Jacqueline Serrano is a 52 y.o., female with a past medical history of hyperthyroidism. She has followed with [facility] since 01/15/2019  for consultative assistance with management of her hyperthyroidism.   HISTORICAL SUMMARY: The patient was first diagnosed with hyperthyroidism in 10/2018  After presenting to her PCp with symptoms or weight loss, headaches, heat intolerance, tremors and some irritability and fatigue. Her TSh was suppressed at < 0.005 uIU/mL. She was initially started on Methimazole  5 mg daily by her PCP, which was increased to 10 in October, 2020  SUBJECTIVE:    Today (02/21/2023):  Ms. [name] is here for a f/u on hyperthyroidism.She is accompanied by a family member.    Interpreter line used today   Alex # [phone] Pt has been noted with weight gain  Denies constipation or diarrhea No recent  tremors Denies local neck symptoms Minimal burning or itching of the eyes  Every morning she has vomiting that she attributes to issues with her throat , no cannabis use  Has occasional  heartburn  6-7 months    Methimazole  5 mg, 1 tab daily     HISTORY:  Past Medical History:  Past Medical History:  Diagnosis Date   Diabetes mellitus    gestational diet controlled   Past Surgical History:  Past Surgical History:  Procedure Laterality Date   CESAREAN SECTION     2 previous   Social History:  reports that she has never smoked. She has never used smokeless tobacco. She reports that she does not drink alcohol and does not use drugs. Family History:  Family History  Problem Relation Age of Onset   Diabetes Mother    Hypertension Sister      HOME MEDICATIONS: Allergies as of  02/21/2023   No Known Allergies      Medication List        Accurate as of February 21, 2023  9:02 AM. If you have any questions, ask your nurse or doctor.          methimazole  5 MG tablet Commonly known as: TAPAZOLE  Take 1 tablet (5 mg total) by mouth daily.          OBJECTIVE:   PHYSICAL EXAM: VS: .BP 122/80 (BP Location: Right Arm, Patient Position: Sitting, Cuff Size: Normal)   Pulse 73   Ht 5' 1 (1.549 m)   Wt 165 lb 12.8 oz (75.2 kg)   SpO2 98%   BMI 31.33 kg/m    EXAM: General: Pt appears well and is in NAD  Eyes: External eye exam normal without stare, lid lag or exophthalmos.   Neck: General: Supple without adenopathy. Thyroid: Thyroid size is prominent.  No nodules appreciated.   Lungs: Clear with good BS bilat  Heart: Auscultation: RRR.  Extremities:  BL LE: No pretibial edema   Mental Status: Judgment, insight: Intact Orientation: Oriented to time, place, and person Mood and affect: No depression, anxiety, or agitation     DATA REVIEWED:   Latest Reference Range & Units 02/21/23 09:38  AG Ratio 1.0 - 2.5 (calc) 1.4  AST 10 - 35 U/L 25  ALT 6 - 29 U/L 42 (H)  Total Protein 6.1 - 8.1 g/dL 8.0  Bilirubin, Direct  0.0 - 0.2 mg/dL 0.1  Indirect Bilirubin 0.2 - 1.2 mg/dL (calc) 0.3  Total Bilirubin 0.2 - 1.2 mg/dL 0.4  Alkaline phosphatase (APISO) 37 - 153 U/L 79  Globulin 1.9 - 3.7 g/dL (calc) 3.4     Latest Reference Range & Units 02/21/23 09:38  TSH mIU/L 3.75  T4,Free(Direct) 0.8 - 1.8 ng/dL 1.2  Albumin MSPROF 3.6 - 5.1 g/dL 4.6     Results for Jacqueline Serrano (MRN [id number]) as of 04/19/2019 09:32  Ref. Range 01/15/2019 11:15  TRAB Latest Ref Range: <=2.00 IU/L 9.49 (H)    ASSESSMENT / PLAN / RECOMMENDATIONS:   Hyperthyroidism Secondary to Graves' Disease:    -Patient is clinically euthyroid -No local neck symptoms - TFT's are normal, will decrease methimazole  as below   Medication  Decrease methimazole  5 mg, 1   tablet Monday through Saturday, none on Sundays    2. Graves' Disease:   - No extra-thyroidal manifestations of Graves' disease -Patient advised to have annual eye exams  3.  Vomiting :  -This is in the morning only for the past 6 months, no weight loss, I suspect GERD, patient advised to start OTC Prilosec 20 mg daily for 6 weeks -ALT slightly elevated, suspect fatty liver -If symptoms do not resolve or worsen, patient to follow-up with PCP   Follow-up in 6 months    Signed electronically by: Stefano Redgie Butts, MD  [facility]  [facility] [address] [place] [place], [place] [address] Phone: [phone] FAX: [phone]      CC: Patient, No Pcp Per No address on file Phone: None  Fax: None   Return to Endocrinology clinic as below: No future appointments.

## 2023-02-21 NOTE — Patient Instructions (Addendum)
 Comience sin receta Prilosec 20 mg, 1 tableta cada maana durante 6 semanas; si los vmitos no mejoran, haga un seguimiento con su PCP.  Start over the counter Prilosec 20 mg, 1 tablet every morning for 6 weeks, if vomiting is not better , please follow up with PCP

## 2023-02-22 LAB — TSH: TSH: 3.75 m[IU]/L

## 2023-02-22 LAB — HEPATIC FUNCTION PANEL
AG Ratio: 1.4 (calc) (ref 1.0–2.5)
ALT: 42 U/L — ABNORMAL HIGH (ref 6–29)
AST: 25 U/L (ref 10–35)
Albumin: 4.6 g/dL (ref 3.6–5.1)
Alkaline phosphatase (APISO): 79 U/L (ref 37–153)
Bilirubin, Direct: 0.1 mg/dL (ref 0.0–0.2)
Globulin: 3.4 g/dL (ref 1.9–3.7)
Indirect Bilirubin: 0.3 mg/dL (ref 0.2–1.2)
Total Bilirubin: 0.4 mg/dL (ref 0.2–1.2)
Total Protein: 8 g/dL (ref 6.1–8.1)

## 2023-02-22 LAB — T4, FREE: Free T4: 1.2 ng/dL (ref 0.8–1.8)

## 2023-02-24 ENCOUNTER — Telehealth: Payer: Self-pay | Admitting: Internal Medicine

## 2023-02-24 ENCOUNTER — Other Ambulatory Visit: Payer: Self-pay

## 2023-02-24 DIAGNOSIS — E0500 Thyrotoxicosis with diffuse goiter without thyrotoxic crisis or storm: Secondary | ICD-10-CM

## 2023-02-24 MED ORDER — METHIMAZOLE 5 MG PO TABS
5.0000 mg | ORAL_TABLET | ORAL | 3 refills | Status: DC
Start: 2023-02-24 — End: 2023-08-22

## 2023-02-24 MED ORDER — METHIMAZOLE 5 MG PO TABS: 5.0000 mg | ORAL_TABLET | ORAL | 3 refills | Status: DC

## 2023-02-24 NOTE — Telephone Encounter (Signed)
 Please let the patient/husband know that her thyroid levels continue to improve, and she will need to decrease methimazole to taking 1 tablet 6 days out of the week, skipping Sundays from now on     Thanks

## 2023-02-24 NOTE — Telephone Encounter (Signed)
 Patient spouse notified and verbalized understanding changes.

## 2023-08-21 ENCOUNTER — Encounter: Payer: Self-pay | Admitting: Internal Medicine

## 2023-08-21 ENCOUNTER — Ambulatory Visit (INDEPENDENT_AMBULATORY_CARE_PROVIDER_SITE_OTHER): Payer: Self-pay | Admitting: Internal Medicine

## 2023-08-21 VITALS — BP 134/88 | HR 73 | Ht 61.0 in | Wt 163.0 lb

## 2023-08-21 DIAGNOSIS — E0590 Thyrotoxicosis, unspecified without thyrotoxic crisis or storm: Secondary | ICD-10-CM

## 2023-08-21 DIAGNOSIS — E0500 Thyrotoxicosis with diffuse goiter without thyrotoxic crisis or storm: Secondary | ICD-10-CM

## 2023-08-21 LAB — T4, FREE: Free T4: 1.2 ng/dL (ref 0.8–1.8)

## 2023-08-21 LAB — TSH: TSH: 2.58 m[IU]/L

## 2023-08-21 NOTE — Progress Notes (Signed)
 Name: Jacqueline Serrano  MRN/ DOB: [id number], [birth year]    Age/ Sex: 52 y.o., female     PCP: Patient, No Pcp Per   Reason for Endocrinology Evaluation: Hyperthyroidism     Initial Endocrinology Clinic Visit: 01/15/2019    PATIENT IDENTIFIER: [name] is a 53 y.o., female with a past medical history of hyperthyroidism. She has followed with [facility] since 01/15/2019  for consultative assistance with management of her hyperthyroidism.   HISTORICAL SUMMARY: The patient was first diagnosed with hyperthyroidism in 10/2018  After presenting to her PCp with symptoms or weight loss, headaches, heat intolerance, tremors and some irritability and fatigue. Her TSh was suppressed at < 0.005 uIU/mL. She was initially started on Methimazole  5 mg daily by her PCP, which was increased to 10 in October, 2020  SUBJECTIVE:    Today (08/21/2023):  [name] is here for a f/u on hyperthyroidism.She is accompanied by a family member.    Interpreter line used today   Salvador # [id number]  She has noted palpitations in the mornings  NO change in weight  Denies constipation or diarrhea Denies tremors Denies local neck symptoms Continues with Minimal burning or itching of the eyes     Methimazole  5 mg, 1 tab Monday through Saturday, none on Sundays    HISTORY:  Past Medical History:  Past Medical History:  Diagnosis Date   Diabetes mellitus    gestational diet controlled   Past Surgical History:  Past Surgical History:  Procedure Laterality Date   CESAREAN SECTION     2 previous   Social History:  reports that she has never smoked. She has never used smokeless tobacco. She reports that she does not drink alcohol and does not use drugs. Family History:  Family History  Problem Relation Age of Onset   Diabetes Mother    Hypertension Sister      HOME MEDICATIONS: Allergies as of 08/21/2023   No Known Allergies      Medication List         Accurate as of August 21, 2023  8:35 AM. If you have any questions, ask your nurse or doctor.          methimazole  5 MG tablet Commonly known as: TAPAZOLE  Take 1 tablet (5 mg total) by mouth as directed. 1 tablet 6 days out of the week   pantoprazole 40 MG tablet Commonly known as: PROTONIX Take 40 mg by mouth daily.          OBJECTIVE:   PHYSICAL EXAM: VS: .BP 134/88 (BP Location: Left Arm, Patient Position: Sitting, Cuff Size: Normal)   Pulse 73   Ht 5' 1 (1.549 m)   Wt 163 lb (73.9 kg)   SpO2 98%   BMI 30.80 kg/m    Filed Weights   08/21/23 0828  Weight: 163 lb (73.9 kg)    EXAM: General: Pt appears well and is in NAD  Eyes: External eye exam normal without stare, lid lag or exophthalmos.   Neck: General: Supple without adenopathy. Thyroid: Thyroid size is prominent.  No nodules appreciated.   Lungs: Clear with good BS bilat  Heart: Auscultation: RRR.  Extremities:  BL LE: No pretibial edema   Mental Status: Judgment, insight: Intact Orientation: Oriented to time, place, and person Mood and affect: No depression, anxiety, or agitation     DATA REVIEWED:   Latest Reference Range & Units 08/21/23 08:50  TSH mIU/L 2.58  T4,Free(Direct) 0.8 - 1.8 ng/dL 1.2  Latest Reference Range & Units 02/21/23 09:38  AG Ratio 1.0 - 2.5 (calc) 1.4  AST 10 - 35 U/L 25  ALT 6 - 29 U/L 42 (H)  Total Protein 6.1 - 8.1 g/dL 8.0  Bilirubin, Direct 0.0 - 0.2 mg/dL 0.1  Indirect Bilirubin 0.2 - 1.2 mg/dL (calc) 0.3  Total Bilirubin 0.2 - 1.2 mg/dL 0.4  Alkaline phosphatase (APISO) 37 - 153 U/L 79  Globulin 1.9 - 3.7 g/dL (calc) 3.4    Results for Jacqueline Serrano, Jacqueline Serrano (MRN [id number]) as of 04/19/2019 09:32  Ref. Range 01/15/2019 11:15  TRAB Latest Ref Range: <=2.00 IU/L 9.49 (H)    ASSESSMENT / PLAN / RECOMMENDATIONS:   Hyperthyroidism Secondary to Graves' Disease:    -Patient is clinically euthyroid -No local neck symptoms - TFTs are normal, we  have room to decrease methimazole   Medication  Methimazole  5 mg, 1  tablet Monday through Friday,, none on Saturdays or Sundays    2. Graves' Disease:   - No extra-thyroidal manifestations of Graves' disease -Patient advised to have annual eye exams    Follow-up in 6 months    Signed electronically by: Stefano Redgie Butts, MD  [facility]  [facility] [address] [place], [place] [address] Phone: [phone] FAX: [phone]      CC: Patient, No Pcp Per No address on file Phone: None  Fax: None   Return to Endocrinology clinic as below: No future appointments.

## 2023-08-22 ENCOUNTER — Ambulatory Visit: Payer: Self-pay | Admitting: Internal Medicine

## 2023-08-22 DIAGNOSIS — E0500 Thyrotoxicosis with diffuse goiter without thyrotoxic crisis or storm: Secondary | ICD-10-CM

## 2023-08-22 MED ORDER — METHIMAZOLE 5 MG PO TABS: 5.0000 mg | ORAL_TABLET | ORAL | 3 refills | Status: DC

## 2023-08-22 NOTE — Telephone Encounter (Signed)
 Please contact the patient through interpreter line and let her know that her thyroid function is normal, we do have room to decrease methimazole  to 5 days a week   So she can take 1 tablet Monday through Friday, none on Saturdays or Sundays   Her heart palpitations are not related to her thyroid since it is normal, patient needs to follow-up with PCP to address palpitations   Thanks

## 2023-08-25 NOTE — Progress Notes (Signed)
 Attempted  to contact patient twice with Interpreter service #ID [phone] and no answer and no vm.

## 2023-09-26 ENCOUNTER — Other Ambulatory Visit (HOSPITAL_BASED_OUTPATIENT_CLINIC_OR_DEPARTMENT_OTHER): Payer: Self-pay | Admitting: Nurse Practitioner

## 2023-09-26 DIAGNOSIS — Z1239 Encounter for other screening for malignant neoplasm of breast: Secondary | ICD-10-CM

## 2024-02-24 ENCOUNTER — Encounter: Payer: Self-pay | Admitting: Internal Medicine

## 2024-02-24 ENCOUNTER — Other Ambulatory Visit: Payer: Self-pay

## 2024-02-24 ENCOUNTER — Ambulatory Visit (INDEPENDENT_AMBULATORY_CARE_PROVIDER_SITE_OTHER): Payer: Self-pay | Admitting: Internal Medicine

## 2024-02-24 VITALS — BP 132/84 | HR 81 | Ht 61.0 in | Wt 163.0 lb

## 2024-02-24 DIAGNOSIS — E0500 Thyrotoxicosis with diffuse goiter without thyrotoxic crisis or storm: Secondary | ICD-10-CM

## 2024-02-24 DIAGNOSIS — E0590 Thyrotoxicosis, unspecified without thyrotoxic crisis or storm: Secondary | ICD-10-CM

## 2024-02-24 NOTE — Progress Notes (Unsigned)
 "  Name: Jacqueline Serrano  MRN/ DOB: [id number], 01-Feb-1971    Age/ Sex: 54 y.o., female     PCP: Patient, No Pcp Per   Reason for Endocrinology Evaluation: Hyperthyroidism     Initial Endocrinology Clinic Visit: 01/15/2019    PATIENT IDENTIFIER: Jacqueline Serrano is a 54 y.o., female with a past medical history of hyperthyroidism. She has followed with [facility] since 01/15/2019  for consultative assistance with management of her hyperthyroidism.   HISTORICAL SUMMARY: The patient was first diagnosed with hyperthyroidism in 10/2018  After presenting to her PCp with symptoms or weight loss, headaches, heat intolerance, tremors and some irritability and fatigue. Her TSh was suppressed at < 0.005 uIU/mL. She was initially started on Methimazole  5 mg daily by her PCP, which was increased to 10 in October, 2020  SUBJECTIVE:    Today (02/24/2024):  Jacqueline Serrano is here for a f/u on hyperthyroidism.   Interpreter line used today   Stan  # [id number]  No local neck swelling  She does endorse occasional palpitations that she attributes to eating certain foods for dinner  Weight remains stable  No constipation or diarrhea  No tremors  She has an upcoming ophthalmology appointment, she is c/o dryness and blurry vision which improve with artifical eye drops   Methimazole  5 mg, 1 tab Monday through Friday, none on Saturdays or Sundays- she continues to take 6 days a week     HISTORY:  Past Medical History:  Past Medical History:  Diagnosis Date   Diabetes mellitus    gestational diet controlled   Past Surgical History:  Past Surgical History:  Procedure Laterality Date   CESAREAN SECTION     2 previous   Social History:  reports that she has never smoked. She has never used smokeless tobacco. She reports that she does not drink alcohol and does not use drugs. Family History:  Family History  Problem Relation Age of Onset   Diabetes Mother     Hypertension Sister      HOME MEDICATIONS: Allergies as of 02/24/2024       Reactions   Clarithromycin Other (See Comments)   clarithromycin        Medication List        Accurate as of February 24, 2024  8:33 AM. If you have any questions, ask your nurse or doctor.          methimazole  5 MG tablet Commonly known as: TAPAZOLE  Take 1 tablet (5 mg total) by mouth as directed. 1 tablet 5 days out of the week   pantoprazole 40 MG tablet Commonly known as: PROTONIX Take 40 mg by mouth daily.          OBJECTIVE:   PHYSICAL EXAM: VS: .BP 132/84   Pulse 81   Ht 5' 1 (1.549 m)   Wt 163 lb (73.9 kg)   BMI 30.80 kg/m    Filed Weights   02/24/24 0821  Weight: 163 lb (73.9 kg)     EXAM: General: Pt appears well and is in NAD  Eyes: External eye exam normal without stare, lid lag or exophthalmos.   Neck: General: Supple without adenopathy. Thyroid: Thyroid size is prominent.  No nodules appreciated.   Lungs: Clear with good BS bilat  Heart: Auscultation: RRR.  Extremities:  BL LE: No pretibial edema   Mental Status: Judgment, insight: Intact Orientation: Oriented to time, place, and person Mood and affect: No depression, anxiety, or agitation  DATA REVIEWED:  *****  Results for LYA, HOLBEN (MRN [id number]) as of 04/19/2019 09:32  Ref. Range 01/15/2019 11:15  TRAB Latest Ref Range: <=2.00 IU/L 9.49 (H)    ASSESSMENT / PLAN / RECOMMENDATIONS:   Hyperthyroidism Secondary to Graves' Disease:    -Patient is clinically euthyroid -No local neck symptoms -We have attempted to decrease her methimazole  on the last visit, but we were unable to get a hold of her, assess the importance of updating her contact information and making sure she has a voicemail -Repeat TFTs today ****   Medication  Methimazole  5 mg, 1  tablet Monday through Saturday none on Sundays    2. Graves' Disease:   - No extra-thyroidal manifestations of Graves'  disease - Patient has an upcoming eye exam  Follow-up in 6 months   Signed electronically by: Stefano Redgie Butts, MD  [facility]  [facility] [address] [place] [place], [place] [address] Phone: [phone] FAX: [phone]      CC: Patient, No Pcp Per No address on file Phone: None  Fax: None   Return to Endocrinology clinic as below: No future appointments.        "

## 2024-02-25 ENCOUNTER — Ambulatory Visit: Payer: Self-pay | Admitting: Internal Medicine

## 2024-02-25 LAB — TSH: TSH: 2.47 m[IU]/L

## 2024-02-25 LAB — T4, FREE: Free T4: 1.2 ng/dL (ref 0.8–1.8)

## 2024-02-25 MED ORDER — METHIMAZOLE 5 MG PO TABS: 5.0000 mg | ORAL_TABLET | ORAL | 3 refills | Status: AC

## 2024-02-25 NOTE — Telephone Encounter (Signed)
 Can you please use the interpreter line and call the patient to let her know that her thyroid levels remain within normal range and she should decrease methimazole  from 6 days a week to 5 days a week.   So she should take methimazole  1 tablet daily Monday through Friday, none on Saturdays or Sundays from now on   Thank you

## 2024-02-26 NOTE — Telephone Encounter (Signed)
Attempted to reach patient no answer or voicemail. 

## 2024-02-26 NOTE — Telephone Encounter (Signed)
 Spoke with patients husband and gave him the results and recommendations to tell the patient.

## 2024-02-26 NOTE — Telephone Encounter (Signed)
-----   Message from Donell Redgie Butts, MD sent at 02/25/2024  1:39 PM EST -----

## 2024-08-16 ENCOUNTER — Ambulatory Visit: Payer: Self-pay | Admitting: Internal Medicine
